# Patient Record
Sex: Female | Born: 1970 | Race: White | Hispanic: No | Marital: Married | Smoking: Never smoker
Health system: Southern US, Community
[De-identification: ages and names within clinical notes are randomized; demographics above are authoritative.]

## PROBLEM LIST (undated history)

## (undated) DIAGNOSIS — J45909 Unspecified asthma, uncomplicated: Secondary | ICD-10-CM

## (undated) DIAGNOSIS — K219 Gastro-esophageal reflux disease without esophagitis: Secondary | ICD-10-CM

## (undated) HISTORY — PX: TUBAL LIGATION: SHX77

## (undated) HISTORY — DX: Unspecified asthma, uncomplicated: J45.909

## (undated) HISTORY — DX: Gastro-esophageal reflux disease without esophagitis: K21.9

---

## 1995-06-13 HISTORY — PX: KNEE SURGERY: SHX244

## 2004-08-04 ENCOUNTER — Ambulatory Visit: Payer: Self-pay | Admitting: Family Medicine

## 2006-10-24 ENCOUNTER — Emergency Department (HOSPITAL_COMMUNITY): Admission: EM | Admit: 2006-10-24 | Discharge: 2006-10-24 | Payer: Self-pay | Admitting: *Deleted

## 2016-03-09 DIAGNOSIS — S39012A Strain of muscle, fascia and tendon of lower back, initial encounter: Secondary | ICD-10-CM | POA: Diagnosis not present

## 2016-03-09 DIAGNOSIS — Z886 Allergy status to analgesic agent status: Secondary | ICD-10-CM | POA: Diagnosis not present

## 2016-03-09 DIAGNOSIS — M545 Low back pain: Secondary | ICD-10-CM | POA: Diagnosis not present

## 2016-07-21 DIAGNOSIS — R7309 Other abnormal glucose: Secondary | ICD-10-CM | POA: Diagnosis not present

## 2016-07-21 DIAGNOSIS — K219 Gastro-esophageal reflux disease without esophagitis: Secondary | ICD-10-CM | POA: Diagnosis not present

## 2016-07-21 DIAGNOSIS — J45909 Unspecified asthma, uncomplicated: Secondary | ICD-10-CM | POA: Diagnosis not present

## 2016-10-03 DIAGNOSIS — L237 Allergic contact dermatitis due to plants, except food: Secondary | ICD-10-CM | POA: Diagnosis not present

## 2016-10-03 DIAGNOSIS — J45909 Unspecified asthma, uncomplicated: Secondary | ICD-10-CM | POA: Diagnosis not present

## 2017-01-23 ENCOUNTER — Encounter: Payer: Self-pay | Admitting: Physician Assistant

## 2017-01-31 ENCOUNTER — Encounter: Payer: Self-pay | Admitting: Physician Assistant

## 2017-01-31 ENCOUNTER — Ambulatory Visit (INDEPENDENT_AMBULATORY_CARE_PROVIDER_SITE_OTHER): Payer: BLUE CROSS/BLUE SHIELD | Admitting: Physician Assistant

## 2017-01-31 VITALS — BP 124/86 | HR 75 | Temp 97.6°F | Ht 63.75 in | Wt 179.6 lb

## 2017-01-31 DIAGNOSIS — Z01419 Encounter for gynecological examination (general) (routine) without abnormal findings: Secondary | ICD-10-CM

## 2017-01-31 DIAGNOSIS — F411 Generalized anxiety disorder: Secondary | ICD-10-CM | POA: Insufficient documentation

## 2017-01-31 DIAGNOSIS — L239 Allergic contact dermatitis, unspecified cause: Secondary | ICD-10-CM

## 2017-01-31 DIAGNOSIS — F321 Major depressive disorder, single episode, moderate: Secondary | ICD-10-CM

## 2017-01-31 DIAGNOSIS — L509 Urticaria, unspecified: Secondary | ICD-10-CM

## 2017-01-31 DIAGNOSIS — K219 Gastro-esophageal reflux disease without esophagitis: Secondary | ICD-10-CM

## 2017-01-31 DIAGNOSIS — R87610 Atypical squamous cells of undetermined significance on cytologic smear of cervix (ASC-US): Secondary | ICD-10-CM | POA: Diagnosis not present

## 2017-01-31 DIAGNOSIS — J4520 Mild intermittent asthma, uncomplicated: Secondary | ICD-10-CM

## 2017-01-31 MED ORDER — OMEPRAZOLE 20 MG PO CPDR: 20.0000 mg | DELAYED_RELEASE_CAPSULE | Freq: Every day | ORAL | 11 refills | Status: DC

## 2017-01-31 MED ORDER — ALPRAZOLAM 0.25 MG PO TABS: 0.2500 mg | ORAL_TABLET | Freq: Two times a day (BID) | ORAL | 0 refills | Status: DC | PRN

## 2017-01-31 MED ORDER — LORATADINE 10 MG PO TABS: 10.0000 mg | ORAL_TABLET | Freq: Every day | ORAL | 11 refills | Status: DC

## 2017-01-31 MED ORDER — CITALOPRAM HYDROBROMIDE 20 MG PO TABS: 10.0000 mg | ORAL_TABLET | Freq: Every day | ORAL | 2 refills | Status: DC

## 2017-01-31 NOTE — Progress Notes (Signed)
BP 124/86   Pulse 75   Temp 97.6 F (36.4 C) (Oral)   Ht 5' 3.75" (1.619 m)   Wt 179 lb 9.6 oz (81.5 kg)   LMP 01/24/2017   BMI 31.07 kg/m    Subjective:    Patient ID: Rebecca Pham, female    DOB: 1970-11-18, 46 y.o.   MRN: [id number]  HPI: Rebecca Pham is a 46 y.o. female presenting on 01/31/2017 for Annual Exam  This patient comes in for annual well physical examination. All medications are reviewed today. There are no reports of any problems with the medications. All of the medical conditions are reviewed and updated.  Lab work is reviewed and will be ordered as medically necessary. There are no new problems reported with today's visit.  Patient reports doing well overall.  Patient does have a history of GERD, mild asthma, depression, generalized anxiety, allergic dermatitis. All of her medications are reviewed and will be updated. We'll plan to recheck her in one month on her depression medication.  Relevant past medical, surgical, family and social history reviewed and updated as indicated. Allergies and medications reviewed and updated.  Past Medical History:  Diagnosis Date  . Asthma   . GERD (gastroesophageal reflux disease)     Past Surgical History:  Procedure Laterality Date  . KNEE SURGERY  1997  . TUBAL LIGATION      Review of Systems  Constitutional: Negative.  Negative for activity change, fatigue and fever.  HENT: Negative.   Eyes: Negative.   Respiratory: Negative.  Negative for cough.   Cardiovascular: Negative.  Negative for chest pain.  Gastrointestinal: Negative.  Negative for abdominal pain.  Endocrine: Negative.   Genitourinary: Negative.  Negative for dysuria.  Musculoskeletal: Negative.   Skin: Negative.   Neurological: Negative.     Allergies as of 01/31/2017      Reactions   Asa [aspirin] Swelling      Medication List       Accurate as of 01/31/17  4:26 PM. Always use your most recent med list.          ALPRAZolam 0.25 MG  tablet Commonly known as:  XANAX Take 1 tablet (0.25 mg total) by mouth 2 (two) times daily as needed for anxiety.   citalopram 20 MG tablet Commonly known as:  CELEXA Take 0.5-1 tablets (10-20 mg total) by mouth daily.   loratadine 10 MG tablet Commonly known as:  CLARITIN Take 1 tablet (10 mg total) by mouth daily.   omeprazole 20 MG capsule Commonly known as:  PRILOSEC Take 1 capsule (20 mg total) by mouth daily.   PROAIR HFA 108 (90 Base) MCG/ACT inhaler Generic drug:  albuterol            Discharge Care Instructions        Start     Ordered   01/31/17 0000  omeprazole (PRILOSEC) 20 MG capsule  Daily    Question:  Supervising Provider  Answer:  Elenora Gamma   01/31/17 1229   01/31/17 0000  citalopram (CELEXA) 20 MG tablet  Daily    Question:  Supervising Provider  Answer:  Elenora Gamma   01/31/17 1229   01/31/17 0000  ALPRAZolam (XANAX) 0.25 MG tablet  2 times daily PRN    Question:  Supervising Provider  Answer:  Elenora Gamma   01/31/17 1234   01/31/17 0000  loratadine (CLARITIN) 10 MG tablet  Daily    Question:  Supervising Provider  Answer:  Elenora Gamma   01/31/17 1234   01/31/17 0000  Pap IG w/ reflex to HPV when ASC-U    Question Answer Comment  Date of LMP? 01/24/2017   Number of Slides/Vials: 1      01/31/17 1245         Objective:    BP 124/86   Pulse 75   Temp 97.6 F (36.4 C) (Oral)   Ht 5' 3.75" (1.619 m)   Wt 179 lb 9.6 oz (81.5 kg)   LMP 01/24/2017   BMI 31.07 kg/m   Allergies  Allergen Reactions  . Asa [Aspirin] Swelling    Physical Exam  Constitutional: She is oriented to person, place, and time. She appears well-developed and well-nourished.  HENT:  Head: Normocephalic and atraumatic.  Eyes: Pupils are equal, round, and reactive to light. Conjunctivae and EOM are normal.  Neck: Normal range of motion. Neck supple.  Cardiovascular: Normal rate, regular rhythm, normal heart sounds and intact distal  pulses.   Pulmonary/Chest: Effort normal and breath sounds normal. Right breast exhibits no mass, no skin change and no tenderness. Left breast exhibits no mass, no skin change and no tenderness. Breasts are symmetrical.  Abdominal: Soft. Bowel sounds are normal.  Genitourinary: Vagina normal and uterus normal. Rectal exam shows no fissure. No breast swelling, tenderness, discharge or bleeding. There is no tenderness or lesion on the right labia. There is no tenderness or lesion on the left labia. Uterus is not deviated, not enlarged and not tender. Cervix exhibits no motion tenderness, no discharge and no friability. Right adnexum displays no mass, no tenderness and no fullness. Left adnexum displays no mass, no tenderness and no fullness. No tenderness or bleeding in the vagina. No vaginal discharge found.  Neurological: She is alert and oriented to person, place, and time. She has normal reflexes.  Skin: Skin is warm and dry. No rash noted.  Psychiatric: She has a normal mood and affect. Her behavior is normal. Judgment and thought content normal.    No results found for this or any previous visit.    Assessment & Plan:   1. Well female exam with routine gynecological exam - Pap IG w/ reflex to HPV when ASC-U Encourage mammogram at her next opportunity at her employer.  2. Gastroesophageal reflux disease without esophagitis - omeprazole (PRILOSEC) 20 MG capsule; Take 1 capsule (20 mg total) by mouth daily.  Dispense: 30 capsule; Refill: 11  3. Mild intermittent asthma without complication - PROAIR HFA 108 (90 Base) MCG/ACT inhaler;   4. Depression, major, single episode, moderate (HCC) - citalopram (CELEXA) 20 MG tablet; Take 0.5-1 tablets (10-20 mg total) by mouth daily.  Dispense: 30 tablet; Refill: 2  5. GAD (generalized anxiety disorder) - ALPRAZolam (XANAX) 0.25 MG tablet; Take 1 tablet (0.25 mg total) by mouth 2 (two) times daily as needed for anxiety.  Dispense: 30 tablet;  Refill: 0  6. Allergic dermatitis - loratadine (CLARITIN) 10 MG tablet; Take 1 tablet (10 mg total) by mouth daily.  Dispense: 30 tablet; Refill: 11  7. Hives    Current Outpatient Prescriptions:  .  ALPRAZolam (XANAX) 0.25 MG tablet, Take 1 tablet (0.25 mg total) by mouth 2 (two) times daily as needed for anxiety., Disp: 30 tablet, Rfl: 0 .  citalopram (CELEXA) 20 MG tablet, Take 0.5-1 tablets (10-20 mg total) by mouth daily., Disp: 30 tablet, Rfl: 2 .  loratadine (CLARITIN) 10 MG tablet, Take 1 tablet (10 mg total) by mouth daily.,  Disp: 30 tablet, Rfl: 11 .  omeprazole (PRILOSEC) 20 MG capsule, Take 1 capsule (20 mg total) by mouth daily., Disp: 30 capsule, Rfl: 11 .  PROAIR HFA 108 (90 Base) MCG/ACT inhaler, , Disp: , Rfl:  Continue all other maintenance medications as listed above.  Follow up plan: Return in about 4 weeks (around 02/28/2017) for recheck.  Educational handout given for survey  Remus Loffler PA-C [facility] [address]  [place], [place] [address] [phone]   01/31/2017, 4:26 PM

## 2017-01-31 NOTE — Patient Instructions (Signed)
In a few days you may receive a survey in the mail or online from [employer] regarding your visit with us today. Please take a moment to fill this out. Your feedback is very important to our whole office. It can help us better understand your needs as well as improve your experience and satisfaction. Thank you for taking your time to complete it. We care about you.  Gaines Cartmell, PA-C  

## 2017-02-07 DIAGNOSIS — E663 Overweight: Secondary | ICD-10-CM | POA: Diagnosis not present

## 2017-02-07 DIAGNOSIS — K219 Gastro-esophageal reflux disease without esophagitis: Secondary | ICD-10-CM | POA: Diagnosis not present

## 2017-02-07 DIAGNOSIS — Z719 Counseling, unspecified: Secondary | ICD-10-CM | POA: Diagnosis not present

## 2017-02-07 DIAGNOSIS — Z008 Encounter for other general examination: Secondary | ICD-10-CM | POA: Diagnosis not present

## 2017-02-07 DIAGNOSIS — J45909 Unspecified asthma, uncomplicated: Secondary | ICD-10-CM | POA: Diagnosis not present

## 2017-02-07 DIAGNOSIS — R7309 Other abnormal glucose: Secondary | ICD-10-CM | POA: Diagnosis not present

## 2017-02-09 ENCOUNTER — Other Ambulatory Visit: Payer: Self-pay

## 2017-02-09 DIAGNOSIS — A630 Anogenital (venereal) warts: Secondary | ICD-10-CM

## 2017-02-09 LAB — PAP IG W/ RFLX HPV ASCU: PAP Smear Comment: 0

## 2017-02-09 LAB — HPV DNA PROBE HIGH RISK, AMPLIFIED: HPV, high-risk: POSITIVE — AB

## 2017-02-09 NOTE — Progress Notes (Signed)
Patient notified of results and she wants to be referred to [place]  Referral placed

## 2017-03-05 ENCOUNTER — Ambulatory Visit (INDEPENDENT_AMBULATORY_CARE_PROVIDER_SITE_OTHER): Payer: BLUE CROSS/BLUE SHIELD | Admitting: Physician Assistant

## 2017-03-05 ENCOUNTER — Encounter (INDEPENDENT_AMBULATORY_CARE_PROVIDER_SITE_OTHER): Payer: Self-pay

## 2017-03-05 ENCOUNTER — Encounter: Payer: Self-pay | Admitting: Physician Assistant

## 2017-03-05 VITALS — BP 132/89 | HR 87 | Temp 98.5°F | Ht 63.75 in | Wt 174.8 lb

## 2017-03-05 DIAGNOSIS — R87610 Atypical squamous cells of undetermined significance on cytologic smear of cervix (ASC-US): Secondary | ICD-10-CM | POA: Diagnosis not present

## 2017-03-05 DIAGNOSIS — F411 Generalized anxiety disorder: Secondary | ICD-10-CM

## 2017-03-05 DIAGNOSIS — R87810 Cervical high risk human papillomavirus (HPV) DNA test positive: Secondary | ICD-10-CM | POA: Diagnosis not present

## 2017-03-05 DIAGNOSIS — F321 Major depressive disorder, single episode, moderate: Secondary | ICD-10-CM | POA: Diagnosis not present

## 2017-03-05 MED ORDER — CITALOPRAM HYDROBROMIDE 20 MG PO TABS: 10.0000 mg | ORAL_TABLET | Freq: Every day | ORAL | 11 refills | Status: DC

## 2017-03-05 NOTE — Progress Notes (Signed)
BP 132/89   Pulse 87   Temp 98.5 F (36.9 C) (Oral)   Ht 5' 3.75" (1.619 m)   Wt 174 lb 12.8 oz (79.3 kg)   BMI 30.24 kg/m    Subjective:    Patient ID: Rebecca Pham, female    DOB: 1970/10/22, 46 y.o.   MRN: [id number]  HPI: Rebecca Pham is a 46 y.o. female presenting on 03/05/2017 for Follow-up (4 week rck ) This patient comes in for recheck on her depression and anxiety. She reports that she is doing much better with the citalopram. She has not had to take any Xanax. She only keeps it for a severe episode. She is having some discord with her current boyfriend. She does not feel in danger for her life. Depression screen [facility] 2/9 03/05/2017 01/31/2017  Decreased Interest 0 0  Down, Depressed, Hopeless 0 0  PHQ - 2 Score 0 0     The patient does have a recent Pap smear that was ASCUS positive with HPV high risk. She has had this before and followed by Dr. Donzetta Matters. We will refer her back to her for further evaluation.  Relevant past medical, surgical, family and social history reviewed and updated as indicated. Allergies and medications reviewed and updated.  Past Medical History:  Diagnosis Date  . Asthma   . GERD (gastroesophageal reflux disease)     Past Surgical History:  Procedure Laterality Date  . KNEE SURGERY  1997  . TUBAL LIGATION      Review of Systems  Constitutional: Negative.  Negative for activity change, fatigue and fever.  HENT: Negative.   Eyes: Negative.   Respiratory: Negative.  Negative for cough.   Cardiovascular: Negative.  Negative for chest pain.  Gastrointestinal: Negative.  Negative for abdominal pain.  Endocrine: Negative.   Genitourinary: Negative.  Negative for dysuria.  Musculoskeletal: Negative.   Skin: Negative.   Neurological: Negative.     Allergies as of 03/05/2017      Reactions   Asa [aspirin] Swelling      Medication List       Accurate as of 03/05/17  1:52 PM. Always use your most recent med list.            ALPRAZolam 0.25 MG tablet Commonly known as:  XANAX Take 1 tablet (0.25 mg total) by mouth 2 (two) times daily as needed for anxiety.   citalopram 20 MG tablet Commonly known as:  CELEXA Take 0.5-1 tablets (10-20 mg total) by mouth daily.   loratadine 10 MG tablet Commonly known as:  CLARITIN Take 1 tablet (10 mg total) by mouth daily.   omeprazole 20 MG capsule Commonly known as:  PRILOSEC Take 1 capsule (20 mg total) by mouth daily.   PROAIR HFA 108 (90 Base) MCG/ACT inhaler Generic drug:  albuterol            Discharge Care Instructions        Start     Ordered   03/05/17 0000  citalopram (CELEXA) 20 MG tablet  Daily    Question:  Supervising Provider  Answer:  Elenora Gamma   03/05/17 1031   03/05/17 0000  Ambulatory referral to Obstetrics / Gynecology    Comments:  Send pap report. Appt next week Wed or Thur   03/05/17 1037         Objective:    BP 132/89   Pulse 87   Temp 98.5 F (36.9 C) (Oral)   Ht 5'  3.75" (1.619 m)   Wt 174 lb 12.8 oz (79.3 kg)   BMI 30.24 kg/m   Allergies  Allergen Reactions  . Asa [Aspirin] Swelling    Physical Exam  Constitutional: She is oriented to person, place, and time. She appears well-developed and well-nourished.  HENT:  Head: Normocephalic and atraumatic.  Eyes: Pupils are equal, round, and reactive to light. Conjunctivae and EOM are normal.  Cardiovascular: Normal rate, regular rhythm, normal heart sounds and intact distal pulses.   Pulmonary/Chest: Effort normal and breath sounds normal.  Abdominal: Soft. Bowel sounds are normal.  Neurological: She is alert and oriented to person, place, and time. She has normal reflexes.  Skin: Skin is warm and dry. No rash noted.  Psychiatric: She has a normal mood and affect. Her behavior is normal. Judgment and thought content normal.  Nursing note and vitals reviewed.   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    PATHOLOGIST PROVIDED ICD10: Comment    Note: Comment    Test Methodology CANCELED    PAP Reflex Comment       Assessment & Plan:   1. Depression, major, single episode, moderate (HCC) - citalopram (CELEXA) 20 MG tablet; Take 0.5-1 tablets (10-20 mg total) by mouth daily.  Dispense: 30 tablet; Refill: 11  2. GAD (generalized anxiety disorder)  3. ASCUS with positive high risk HPV cervical - Ambulatory referral to Obstetrics / Gynecology    Current Outpatient Prescriptions:  .  ALPRAZolam (XANAX) 0.25 MG tablet, Take 1 tablet (0.25 mg total) by mouth 2 (two) times daily as needed for anxiety., Disp: 30 tablet, Rfl: 0 .  citalopram (CELEXA) 20 MG tablet, Take 0.5-1 tablets (10-20 mg total) by mouth daily., Disp: 30 tablet, Rfl: 11 .  loratadine (CLARITIN) 10 MG tablet, Take 1 tablet (10 mg total) by mouth daily., Disp: 30 tablet, Rfl: 11 .  omeprazole (PRILOSEC) 20 MG capsule, Take 1 capsule (20 mg total) by mouth daily., Disp: 30 capsule, Rfl: 11 .  PROAIR HFA 108 (90 Base) MCG/ACT inhaler, , Disp: , Rfl:  Continue all other maintenance medications as listed above.  Follow up plan: Return in about 6 months (around 09/02/2017) for recheck.  Educational handout given for survey  Remus Loffler PA-C [facility] [address]  [place], [place] [address] [phone]   03/05/2017, 1:52 PM

## 2017-03-05 NOTE — Patient Instructions (Signed)
In a few days you may receive a survey in the mail or online from [employer] regarding your visit with us today. Please take a moment to fill this out. Your feedback is very important to our whole office. It can help us better understand your needs as well as improve your experience and satisfaction. Thank you for taking your time to complete it. We care about you.  Caine Barfield, PA-C  

## 2017-04-09 DIAGNOSIS — R0683 Snoring: Secondary | ICD-10-CM | POA: Diagnosis not present

## 2017-04-09 DIAGNOSIS — E663 Overweight: Secondary | ICD-10-CM | POA: Diagnosis not present

## 2017-04-21 DIAGNOSIS — J45901 Unspecified asthma with (acute) exacerbation: Secondary | ICD-10-CM | POA: Diagnosis not present

## 2017-04-21 DIAGNOSIS — R05 Cough: Secondary | ICD-10-CM | POA: Diagnosis not present

## 2017-04-21 DIAGNOSIS — J069 Acute upper respiratory infection, unspecified: Secondary | ICD-10-CM | POA: Diagnosis not present

## 2017-04-26 DIAGNOSIS — R87810 Cervical high risk human papillomavirus (HPV) DNA test positive: Secondary | ICD-10-CM | POA: Diagnosis not present

## 2017-04-26 DIAGNOSIS — R87610 Atypical squamous cells of undetermined significance on cytologic smear of cervix (ASC-US): Secondary | ICD-10-CM | POA: Diagnosis not present

## 2017-04-26 DIAGNOSIS — N871 Moderate cervical dysplasia: Secondary | ICD-10-CM | POA: Diagnosis not present

## 2017-05-24 DIAGNOSIS — D069 Carcinoma in situ of cervix, unspecified: Secondary | ICD-10-CM | POA: Diagnosis not present

## 2017-05-24 DIAGNOSIS — Z6828 Body mass index (BMI) 28.0-28.9, adult: Secondary | ICD-10-CM | POA: Diagnosis not present

## 2017-06-29 ENCOUNTER — Ambulatory Visit (INDEPENDENT_AMBULATORY_CARE_PROVIDER_SITE_OTHER): Payer: BLUE CROSS/BLUE SHIELD | Admitting: Family Medicine

## 2017-06-29 ENCOUNTER — Encounter: Payer: Self-pay | Admitting: Family Medicine

## 2017-06-29 VITALS — BP 129/84 | HR 78 | Temp 98.4°F | Ht 63.75 in | Wt 182.0 lb

## 2017-06-29 DIAGNOSIS — J4521 Mild intermittent asthma with (acute) exacerbation: Secondary | ICD-10-CM

## 2017-06-29 MED ORDER — PREDNISONE 20 MG PO TABS: ORAL_TABLET | ORAL | 0 refills | Status: DC

## 2017-06-29 NOTE — Progress Notes (Signed)
BP 129/84   Pulse 78   Temp 98.4 F (36.9 C) (Oral)   Ht 5' 3.75" (1.619 m)   Wt 182 lb (82.6 kg)   SpO2 99%   BMI 31.49 kg/m    Subjective:    Patient ID: Rebecca Pham, female    DOB: 01/04/1971, 47 y.o.   MRN: [id number]  HPI: Rebecca Pham is a 47 y.o. female presenting on 06/29/2017 for Cough (tried Robitussin DM but it caused an allergic reaction with her Celexa); Asthma; and Left ear pain   Cough  Associated symptoms include ear pain (left ear pain), postnasal drip, rhinorrhea and shortness of breath. Pertinent negatives include no chest pain, chills, fever, sore throat or wheezing. Her past medical history is significant for asthma.  Asthma  She complains of cough (dry, constant cough) and shortness of breath. There is no wheezing. Associated symptoms include ear pain (left ear pain), postnasal drip and rhinorrhea. Pertinent negatives include no chest pain, fever or sore throat. Her past medical history is significant for asthma.   Patient presents to clinic with dry cough x 1 week that is worsening her asthma symptoms. She was treated in the ED right before Christmas for breathing difficulties and a viral infection which was treated with prednisone. She describes her cough as a dry, constant cough with no relief. She is also complaining of a left earache, congestion, and trouble breathing. She tried Robitussin DM a week ago, but had an allergic reaction that caused swelling of the lips. She has also been taking Tylenol XR to relieve her symptoms. She admits to using her Albuterol inhaler multiple times per day to relieve her shortness of breath. Patient denies fever, chills, wheezing, hemoptyosis, chest pain, and sore throat.   Relevant past medical, surgical, family and social history reviewed and updated as indicated. Interim medical history since our last visit reviewed. Allergies and medications reviewed and updated.  Review of Systems  Constitutional: Negative for chills,  fatigue and fever.  HENT: Positive for congestion, ear pain (left ear pain), postnasal drip and rhinorrhea. Negative for ear discharge, sinus pressure, sinus pain and sore throat.   Eyes: Negative for discharge and itching.  Respiratory: Positive for cough (dry, constant cough) and shortness of breath. Negative for wheezing.   Cardiovascular: Negative for chest pain.  Gastrointestinal: Negative for diarrhea and nausea.    Per HPI unless specifically indicated above   Allergies as of 06/29/2017      Reactions   Asa [aspirin] Swelling      Medication List        Accurate as of 06/29/17  1:53 PM. Always use your most recent med list.          ALPRAZolam 0.25 MG tablet Commonly known as:  XANAX Take 1 tablet (0.25 mg total) by mouth 2 (two) times daily as needed for anxiety.   citalopram 20 MG tablet Commonly known as:  CELEXA Take 0.5-1 tablets (10-20 mg total) by mouth daily.   loratadine 10 MG tablet Commonly known as:  CLARITIN Take 1 tablet (10 mg total) by mouth daily.   omeprazole 20 MG capsule Commonly known as:  PRILOSEC Take 1 capsule (20 mg total) by mouth daily.   predniSONE 20 MG tablet Commonly known as:  DELTASONE Take 3 tabs daily for 1 week, then 2 tabs daily for week 2, then 1 tab daily for week 3.   PROAIR HFA 108 (90 Base) MCG/ACT inhaler Generic drug:  albuterol  Objective:    BP 129/84   Pulse 78   Temp 98.4 F (36.9 C) (Oral)   Ht 5' 3.75" (1.619 m)   Wt 182 lb (82.6 kg)   SpO2 99%   BMI 31.49 kg/m   Wt Readings from Last 3 Encounters:  06/29/17 182 lb (82.6 kg)  03/05/17 174 lb 12.8 oz (79.3 kg)  01/31/17 179 lb 9.6 oz (81.5 kg)    Physical Exam  Constitutional: She is oriented to person, place, and time. She appears well-developed and well-nourished. No distress.  HENT:  Right Ear: External ear normal.  Left Ear: External ear normal.  Nose: Nose normal.  Mouth/Throat: Posterior oropharyngeal erythema (mild) present.  No oropharyngeal exudate.  Eyes: Conjunctivae are normal. Right eye exhibits no discharge. Left eye exhibits no discharge.  Cardiovascular: Normal rate and regular rhythm. Exam reveals gallop. Exam reveals no friction rub.  No murmur heard. Pulmonary/Chest: Effort normal and breath sounds normal. No accessory muscle usage. No respiratory distress. She has no wheezes. She has no rhonchi. She has no rales.  Lymphadenopathy:    She has no cervical adenopathy.  Neurological: She is alert and oriented to person, place, and time.  Psychiatric: She has a normal mood and affect. Her behavior is normal.      Assessment & Plan:   Problem List Items Addressed This Visit    None    Visit Diagnoses    Mild intermittent asthma with exacerbation    -  Primary   Gave refill and prednisone and recommended continue albuterol likely viral.   Relevant Medications   predniSONE (DELTASONE) 20 MG tablet     Patient presents to clinic with cough x1 week with difficulties breathing. Patient notes that the cough has caused her asthma to worsen which has required her to use her albuterol inhaler multiple times a day. Physical exam was insignificant except for mild erythema in the throat. Patient was started on prednisone 20mg  tablets x 3 weeks. She was given a sample ICS (BREO) to see if it helps with her breathing difficulties and was instructed to continue her albuterol inhaler as needed. She will follow-up with Rebecca Pham in about two weeks prior to her surgery to ensure her lung function is normal.  Follow up plan: Return if symptoms worsen or fail to improve.  Counseling provided for all of the vaccine components No orders of the defined types were placed in this encounter.  Patient seen and examined with Caprice Renshaw PA student.  Agree with assessment and plan above. Arville Care, MD [facility] 07/03/2017, 10:09 PM

## 2017-07-04 ENCOUNTER — Ambulatory Visit: Payer: BLUE CROSS/BLUE SHIELD | Admitting: Physician Assistant

## 2017-07-11 ENCOUNTER — Ambulatory Visit: Payer: BLUE CROSS/BLUE SHIELD | Admitting: Physician Assistant

## 2017-07-17 ENCOUNTER — Other Ambulatory Visit: Payer: Self-pay | Admitting: Family Medicine

## 2017-07-23 DIAGNOSIS — K219 Gastro-esophageal reflux disease without esophagitis: Secondary | ICD-10-CM | POA: Diagnosis not present

## 2017-07-23 DIAGNOSIS — Z886 Allergy status to analgesic agent status: Secondary | ICD-10-CM | POA: Diagnosis not present

## 2017-07-23 DIAGNOSIS — F419 Anxiety disorder, unspecified: Secondary | ICD-10-CM | POA: Diagnosis not present

## 2017-07-23 DIAGNOSIS — Z9851 Tubal ligation status: Secondary | ICD-10-CM | POA: Diagnosis not present

## 2017-07-23 DIAGNOSIS — J45909 Unspecified asthma, uncomplicated: Secondary | ICD-10-CM | POA: Diagnosis not present

## 2017-07-23 DIAGNOSIS — Z79899 Other long term (current) drug therapy: Secondary | ICD-10-CM | POA: Diagnosis not present

## 2017-07-23 DIAGNOSIS — N840 Polyp of corpus uteri: Secondary | ICD-10-CM | POA: Diagnosis not present

## 2017-07-23 DIAGNOSIS — N926 Irregular menstruation, unspecified: Secondary | ICD-10-CM | POA: Diagnosis not present

## 2017-07-23 DIAGNOSIS — D62 Acute posthemorrhagic anemia: Secondary | ICD-10-CM | POA: Diagnosis not present

## 2017-07-23 DIAGNOSIS — D251 Intramural leiomyoma of uterus: Secondary | ICD-10-CM | POA: Diagnosis not present

## 2017-07-23 DIAGNOSIS — F329 Major depressive disorder, single episode, unspecified: Secondary | ICD-10-CM | POA: Diagnosis not present

## 2017-07-23 DIAGNOSIS — R220 Localized swelling, mass and lump, head: Secondary | ICD-10-CM | POA: Diagnosis not present

## 2017-07-23 DIAGNOSIS — N871 Moderate cervical dysplasia: Secondary | ICD-10-CM | POA: Diagnosis not present

## 2017-07-24 ENCOUNTER — Encounter: Payer: Self-pay | Admitting: Family Medicine

## 2017-07-24 DIAGNOSIS — Z886 Allergy status to analgesic agent status: Secondary | ICD-10-CM | POA: Diagnosis not present

## 2017-07-24 DIAGNOSIS — N871 Moderate cervical dysplasia: Secondary | ICD-10-CM | POA: Diagnosis not present

## 2017-07-24 DIAGNOSIS — J45909 Unspecified asthma, uncomplicated: Secondary | ICD-10-CM | POA: Diagnosis not present

## 2017-07-24 DIAGNOSIS — D251 Intramural leiomyoma of uterus: Secondary | ICD-10-CM | POA: Diagnosis not present

## 2017-07-24 DIAGNOSIS — Z79899 Other long term (current) drug therapy: Secondary | ICD-10-CM | POA: Diagnosis not present

## 2017-07-24 DIAGNOSIS — K219 Gastro-esophageal reflux disease without esophagitis: Secondary | ICD-10-CM | POA: Diagnosis not present

## 2017-07-24 DIAGNOSIS — N840 Polyp of corpus uteri: Secondary | ICD-10-CM | POA: Diagnosis not present

## 2017-07-24 DIAGNOSIS — R220 Localized swelling, mass and lump, head: Secondary | ICD-10-CM | POA: Diagnosis not present

## 2017-07-24 DIAGNOSIS — D62 Acute posthemorrhagic anemia: Secondary | ICD-10-CM | POA: Diagnosis not present

## 2017-07-24 DIAGNOSIS — F329 Major depressive disorder, single episode, unspecified: Secondary | ICD-10-CM | POA: Diagnosis not present

## 2017-07-24 DIAGNOSIS — Z9851 Tubal ligation status: Secondary | ICD-10-CM | POA: Diagnosis not present

## 2017-07-24 DIAGNOSIS — N879 Dysplasia of cervix uteri, unspecified: Secondary | ICD-10-CM | POA: Diagnosis not present

## 2017-07-24 DIAGNOSIS — N926 Irregular menstruation, unspecified: Secondary | ICD-10-CM | POA: Diagnosis not present

## 2017-07-24 DIAGNOSIS — F419 Anxiety disorder, unspecified: Secondary | ICD-10-CM | POA: Diagnosis not present

## 2017-07-25 DIAGNOSIS — D251 Intramural leiomyoma of uterus: Secondary | ICD-10-CM | POA: Diagnosis not present

## 2017-07-25 DIAGNOSIS — J45909 Unspecified asthma, uncomplicated: Secondary | ICD-10-CM | POA: Diagnosis not present

## 2017-07-25 DIAGNOSIS — N871 Moderate cervical dysplasia: Secondary | ICD-10-CM | POA: Diagnosis not present

## 2017-07-25 DIAGNOSIS — R220 Localized swelling, mass and lump, head: Secondary | ICD-10-CM | POA: Diagnosis not present

## 2017-07-25 DIAGNOSIS — N840 Polyp of corpus uteri: Secondary | ICD-10-CM | POA: Diagnosis not present

## 2017-07-25 DIAGNOSIS — D62 Acute posthemorrhagic anemia: Secondary | ICD-10-CM | POA: Diagnosis not present

## 2017-07-25 DIAGNOSIS — F329 Major depressive disorder, single episode, unspecified: Secondary | ICD-10-CM | POA: Diagnosis not present

## 2017-07-25 DIAGNOSIS — Z79899 Other long term (current) drug therapy: Secondary | ICD-10-CM | POA: Diagnosis not present

## 2017-07-25 DIAGNOSIS — F419 Anxiety disorder, unspecified: Secondary | ICD-10-CM | POA: Diagnosis not present

## 2017-07-25 DIAGNOSIS — N926 Irregular menstruation, unspecified: Secondary | ICD-10-CM | POA: Diagnosis not present

## 2017-07-25 DIAGNOSIS — K219 Gastro-esophageal reflux disease without esophagitis: Secondary | ICD-10-CM | POA: Diagnosis not present

## 2017-07-25 DIAGNOSIS — Z886 Allergy status to analgesic agent status: Secondary | ICD-10-CM | POA: Diagnosis not present

## 2017-07-25 DIAGNOSIS — Z9851 Tubal ligation status: Secondary | ICD-10-CM | POA: Diagnosis not present

## 2017-07-31 ENCOUNTER — Ambulatory Visit: Payer: BLUE CROSS/BLUE SHIELD | Admitting: Family Medicine

## 2017-07-31 VITALS — BP 115/81 | HR 92 | Temp 97.9°F | Ht 64.0 in | Wt 188.0 lb

## 2017-07-31 DIAGNOSIS — S46001A Unspecified injury of muscle(s) and tendon(s) of the rotator cuff of right shoulder, initial encounter: Secondary | ICD-10-CM

## 2017-07-31 NOTE — Progress Notes (Signed)
Subjective: CC: arm injury PCP: Rebecca LofflerJones, [name], PA-C XBJ:[id number]:Rebecca Pham is a 47 y.o. female presenting to clinic today for:  1. Arm injury Patient reports falling on an outstretched arm about 10 days ago.  She notes she fell over a plantar onto her stomach.  She reports immediate pain in the right shoulder radiating down to the right tricep.  She notes that she now cannot raise her right arm without pain.  She denies any upper extremity weakness, numbness, tingling, joint swelling, ecchymosis.  She has been using oxycodone that she has been prescribed for her hysterectomy with some improvement in pain.   ROS: Per HPI  Allergies  Allergen Reactions  . Asa [Aspirin] Swelling   Past Medical History:  Diagnosis Date  . Asthma   . GERD (gastroesophageal reflux disease)     Current Outpatient Medications:  .  ALPRAZolam (XANAX) 0.25 MG tablet, Take 1 tablet (0.25 mg total) by mouth 2 (two) times daily as needed for anxiety., Disp: 30 tablet, Rfl: 0 .  citalopram (CELEXA) 20 MG tablet, Take 0.5-1 tablets (10-20 mg total) by mouth daily., Disp: 30 tablet, Rfl: 11 .  loratadine (CLARITIN) 10 MG tablet, Take 1 tablet (10 mg total) by mouth daily., Disp: 30 tablet, Rfl: 11 .  omeprazole (PRILOSEC) 20 MG capsule, Take 1 capsule (20 mg total) by mouth daily., Disp: 30 capsule, Rfl: 11 .  predniSONE (DELTASONE) 20 MG tablet, TAKE 3 TABS DAILY FOR 1 WEEK, THEN 2 TABS DAILY FOR WEEK 2, THEN 1 TAB DAILY FOR WEEK 3., Disp: 42 tablet, Rfl: 0 .  PROAIR HFA 108 (90 Base) MCG/ACT inhaler, , Disp: , Rfl:  Social History   Socioeconomic History  . Marital status: Single    Spouse name: Not on file  . Number of children: Not on file  . Years of education: Not on file  . Highest education level: Not on file  Social Needs  . Financial resource strain: Not on file  . Food insecurity - worry: Not on file  . Food insecurity - inability: Not on file  . Transportation needs - medical: Not on file  .  Transportation needs - non-medical: Not on file  Occupational History  . Not on file  Tobacco Use  . Smoking status: Never Smoker  . Smokeless tobacco: Never Used  Substance and Sexual Activity  . Alcohol use: Yes    Comment: occ  . Drug use: No  . Sexual activity: Yes    Birth control/protection: Surgical  Other Topics Concern  . Not on file  Social History Narrative  . Not on file   Family History  Problem Relation Age of Onset  . COPD Father   . Arthritis Father   . Cancer Maternal Grandmother        lung  . Vision loss Paternal Grandfather   . Diabetes Paternal Grandfather   . Heart disease Paternal Grandfather     Objective: Office vital signs reviewed. BP 115/81   Pulse 92   Temp 97.9 F (36.6 C) (Oral)   Ht 5\' 4"  (1.626 m)   Wt 188 lb (85.3 kg)   BMI 32.27 kg/m   Physical Examination:  General: Awake, alert, well nourished, No acute distress Extremities: warm, well perfused, No edema, cyanosis or clubbing; +2 pulses bilaterally MSK: normal gait and normal station  RUE: Has about a 15 degree loss in abduction of the right shoulder.  She is able to raise her right upper extremity but  has significant pain when doing so.  No palpable abnormalities.  No point tenderness to palpation of the right shoulder or clavicle.  No gross deformities noted.  No erythema, ecchymosis.  No swelling.  She does have pain to deep palpation to the tricep.  Positive empty can test on the right. Skin: dry; intact; no rashes or lesions Neuro: 5/5 UE Strength and light touch sensation grossly intact  Assessment/ Plan: 47 y.o. female   1. Injury of right rotator cuff, initial encounter Given her physical exam, I am worried about possible tear of the supraspinatus on the right.  Given the acute nature, I have referred her urgently to orthopedics for further evaluation.  Could consider ultrasound to further evaluate the rotator cuff.  She has oxycodone at home.  I did recommend that she  consider taking an anti-inflammatory like ibuprofen or Aleve.  I recommended icing.  Avoid pain exacerbating activities. - Ambulatory referral to Orthopedic Surgery   Orders Placed This Encounter  Procedures  . Ambulatory referral to Orthopedic Surgery    Referral Priority:   Urgent    Referral Type:   Surgical    Referral Reason:   Specialty Services Required    Requested Specialty:   Orthopedic Surgery    Number of Visits Requested:   1   Cranford Blessinger Hulen Skains, DO [facility] [phone]

## 2017-07-31 NOTE — Patient Instructions (Signed)
I have a high suspicion that you injured your rotator cuff.  There may be a tear.  I have referred you to the orthopedics for further evaluation.  You may continue your medications as prescribed for pain.  If you develop more pronounced weakness, numbness, tingling, joint swelling, please seek immediate medical attention.   Rotator Cuff Injury Rotator cuff injury is any type of injury to the set of muscles and tendons that make up the stabilizing unit of your shoulder. This unit holds the ball of your upper arm bone (humerus) in the socket of your shoulder blade (scapula). What are the causes? Injuries to your rotator cuff most commonly come from sports or activities that cause your arm to be moved repeatedly over your head. Examples of this include throwing, weight lifting, swimming, or racquet sports. Long lasting (chronic) irritation of your rotator cuff can cause soreness and swelling (inflammation), bursitis, and eventual damage to your tendons, such as a tear (rupture). What are the signs or symptoms? Acute rotator cuff tear:  Sudden tearing sensation followed by severe pain shooting from your upper shoulder down your arm toward your elbow.  Decreased range of motion of your shoulder because of pain and muscle spasm.  Severe pain.  Inability to raise your arm out to the side because of pain and loss of muscle power (large tears).  Chronic rotator cuff tear:  Pain that usually is worse at night and may interfere with sleep.  Gradual weakness and decreased shoulder motion as the pain worsens.  Decreased range of motion.  Rotator cuff tendinitis:  Deep ache in your shoulder and the outside upper arm over your shoulder.  Pain that comes on gradually and becomes worse when lifting your arm to the side or turning it inward.  How is this diagnosed? Rotator cuff injury is diagnosed through a medical history, physical exam, and imaging exam. The medical history helps determine the type  of rotator cuff injury. Your health care provider will look at your injured shoulder, feel the injured area, and ask you to move your shoulder in different positions. X-ray exams typically are done to rule out other causes of shoulder pain, such as fractures. MRI is the exam of choice for the most severe shoulder injuries because the images show muscles and tendons. How is this treated? Chronic tear:  Medicine for pain, such as acetaminophen or ibuprofen.  Physical therapy and range-of-motion exercises may be helpful in maintaining shoulder function and strength.  Steroid injections into your shoulder joint.  Surgical repair of the rotator cuff if the injury does not heal with noninvasive treatment.  Acute tear:  Anti-inflammatory medicines such as ibuprofen and naproxen to help reduce pain and swelling.  A sling to help support your arm and rest your rotator cuff muscles. Long-term use of a sling is not advised. It may cause significant stiffening of the shoulder joint.  Surgery may be considered within a few weeks, especially in younger, active people, to return the shoulder to full function.  Indications for surgical treatment include the following: ? Age younger than 60 years. ? Rotator cuff tears that are complete. ? Physical therapy, rest, and anti-inflammatory medicines have been used for 6-8 weeks, with no improvement. ? Employment or sporting activity that requires constant shoulder use.  Tendinitis:  Anti-inflammatory medicines such as ibuprofen and naproxen to help reduce pain and swelling.  A sling to help support your arm and rest your rotator cuff muscles. Long-term use of a sling is not  advised. It may cause significant stiffening of the shoulder joint.  Severe tendinitis may require: ? Steroid injections into your shoulder joint. ? Physical therapy. ? Surgery.  Follow these instructions at home:  Apply ice to your injury: ? Put ice in a plastic bag. ? Place a  towel between your skin and the bag. ? Leave the ice on for 20 minutes, 2-3 times a day.  If you have a shoulder immobilizer (sling and straps), wear it until told otherwise by your health care provider.  You may want to sleep on several pillows or in a recliner at night to lessen swelling and pain.  Only take over-the-counter or prescription medicines for pain, discomfort, or fever as directed by your health care provider.  Do simple hand squeezing exercises with a soft rubber ball to decrease hand swelling. Contact a health care provider if:  Your shoulder pain increases, or new pain or numbness develops in your arm, hand, or fingers.  Your hand or fingers are colder than your other hand. Get help right away if:  Your arm, hand, or fingers are numb or tingling.  Your arm, hand, or fingers are increasingly swollen and painful, or they turn white or blue. This information is not intended to replace advice given to you by your health care provider. Make sure you discuss any questions you have with your health care provider. Document Released: 05/26/2000 Document Revised: 11/04/2015 Document Reviewed: 01/08/2013 Elsevier Interactive Patient Education  2018 [employer]

## 2017-08-09 ENCOUNTER — Ambulatory Visit (INDEPENDENT_AMBULATORY_CARE_PROVIDER_SITE_OTHER): Payer: Self-pay

## 2017-08-09 ENCOUNTER — Ambulatory Visit (INDEPENDENT_AMBULATORY_CARE_PROVIDER_SITE_OTHER): Payer: BLUE CROSS/BLUE SHIELD | Admitting: Orthopaedic Surgery

## 2017-08-09 ENCOUNTER — Encounter (INDEPENDENT_AMBULATORY_CARE_PROVIDER_SITE_OTHER): Payer: Self-pay | Admitting: Orthopaedic Surgery

## 2017-08-09 VITALS — BP 117/83 | HR 97 | Ht 64.0 in | Wt 188.0 lb

## 2017-08-09 DIAGNOSIS — M25511 Pain in right shoulder: Secondary | ICD-10-CM

## 2017-08-09 NOTE — Progress Notes (Signed)
Office Visit Note-orthopedic consultation   Patient: Rebecca Pham           Date of Birth: 1971-05-28           MRN: [id number] Visit Date: 08/09/2017              Requested by: Raliegh Ip, DO [id number] [place], [place] [address] PCP: Remus Loffler, PA-C   Assessment & Plan: Visit Diagnoses:  1. Acute pain of right shoulder     Plan: Has a strain of her shoulder.  Rotator cuff testing is intact.  I will check her in 3 weeks if she is having persistent problems will consider diagnostic imaging.  Labral tests are negative and biceps triceps are intact. Thank you for  the opportunity see her in consultation Follow-Up Instructions: No Follow-up on file.   Orders:  Orders Placed This Encounter  Procedures  . XR Shoulder Right   No orders of the defined types were placed in this encounter.     Procedures: No procedures performed   Clinical Data: No additional findings.   Subjective: Chief Complaint  Patient presents with  . Right Shoulder - Pain    HPI 47 year old female coming in a restaurant tripped against a flowerpot fell forward with outstretched hands.  She injured her right shoulder she states she could not get up for a short period of time and had persistent pain in her right shoulder that radiates down to the deltoid insertion since that time.  No history of past shoulder injury.  She had some abrasion over the right side of her lower abdomen and that has resolved.  Date of fall she thinks was on 07/21/2017 she had some oxycodone from previous hysterectomy she is in the postop time period from this and is out of work until 09/06/2017 from her GYN surgery.  She denies any neck pain no numbness or tingling in her fingers she has pain with activities of daily living washing her hair getting dressed putting her clothes on.  She has pain with motion of her arm and is been persistent for 2 weeks.  Review of Systems 14 point review of systems reviewed and is  noncontributory as it pertains HPI.  Recent hysterectomy surgery history of GI reflux she cannot take anti-inflammatories.   Objective: Vital Signs: BP 117/83   Pulse 97   Ht 5\' 4"  (1.626 m)   Wt 188 lb (85.3 kg)   BMI 32.27 kg/m   Physical Exam  Constitutional: She is oriented to person, place, and time. She appears well-developed.  HENT:  Head: Normocephalic.  Right Ear: External ear normal.  Left Ear: External ear normal.  Eyes: Pupils are equal, round, and reactive to light.  Neck: No tracheal deviation present. No thyromegaly present.  Cardiovascular: Normal rate.  Pulmonary/Chest: Effort normal.  Abdominal: Soft.  Neurological: She is alert and oriented to person, place, and time.  Skin: Skin is warm and dry.  Psychiatric: She has a normal mood and affect. Her behavior is normal.    Ortho Exam pain with outstretched reaching overhead reaching and is limited to 90 degrees.  Supraspinatus isolated testing is stable negative anterior subluxation with external rotation.  She has some tenderness over the long head biceps.  With range of motion pain radiates to the deltoid insertion site.  No supraspinatus weakness on testing.  No supra spinatus atrophy.  Internal/external rotation is strong.  No ecchymosis pectoralis is normal.  The elbow has full  range of motion station her hand is normal she has good cervical range of motion.  Normal heel toe gait. Specialty Comments:  No specialty comments available.  Imaging: Xr Shoulder Right  Result Date: 08/09/2017 X-rays right shoulder obtained and reviewed.  This shows normal AC joint.  Glenohumeral joint is normal negative for acute fracture after her fall. Impression: Right shoulder x-rays negative for acute injury.    PMFS History: Patient Active Problem List   Diagnosis Date Noted  . ASCUS with positive high risk HPV cervical 03/05/2017  . Gastroesophageal reflux disease without esophagitis 01/31/2017  . Mild intermittent  asthma without complication 01/31/2017  . Depression, major, single episode, moderate (HCC) 01/31/2017  . GAD (generalized anxiety disorder) 01/31/2017  . Allergic dermatitis 01/31/2017  . Hives 01/31/2017   Past Medical History:  Diagnosis Date  . Asthma   . GERD (gastroesophageal reflux disease)     Family History  Problem Relation Age of Onset  . COPD Father   . Arthritis Father   . Cancer Maternal Grandmother        lung  . Vision loss Paternal Grandfather   . Diabetes Paternal Grandfather   . Heart disease Paternal Grandfather     Past Surgical History:  Procedure Laterality Date  . KNEE SURGERY  1997  . TUBAL LIGATION     Social History   Occupational History  . Not on file  Tobacco Use  . Smoking status: Never Smoker  . Smokeless tobacco: Never Used  Substance and Sexual Activity  . Alcohol use: Yes    Comment: occ  . Drug use: No  . Sexual activity: Yes    Birth control/protection: Surgical

## 2017-10-17 DIAGNOSIS — Z008 Encounter for other general examination: Secondary | ICD-10-CM | POA: Diagnosis not present

## 2017-10-17 DIAGNOSIS — Z6831 Body mass index (BMI) 31.0-31.9, adult: Secondary | ICD-10-CM | POA: Diagnosis not present

## 2017-10-17 DIAGNOSIS — Z719 Counseling, unspecified: Secondary | ICD-10-CM | POA: Diagnosis not present

## 2017-10-17 DIAGNOSIS — Z1331 Encounter for screening for depression: Secondary | ICD-10-CM | POA: Diagnosis not present

## 2017-10-17 DIAGNOSIS — R7309 Other abnormal glucose: Secondary | ICD-10-CM | POA: Diagnosis not present

## 2017-11-20 DIAGNOSIS — Z1231 Encounter for screening mammogram for malignant neoplasm of breast: Secondary | ICD-10-CM | POA: Diagnosis not present

## 2017-12-26 DIAGNOSIS — Z719 Counseling, unspecified: Secondary | ICD-10-CM | POA: Diagnosis not present

## 2017-12-26 DIAGNOSIS — Z008 Encounter for other general examination: Secondary | ICD-10-CM | POA: Diagnosis not present

## 2017-12-26 DIAGNOSIS — K219 Gastro-esophageal reflux disease without esophagitis: Secondary | ICD-10-CM | POA: Diagnosis not present

## 2017-12-26 DIAGNOSIS — J45909 Unspecified asthma, uncomplicated: Secondary | ICD-10-CM | POA: Diagnosis not present

## 2018-01-01 ENCOUNTER — Encounter: Payer: Self-pay | Admitting: Physician Assistant

## 2018-01-01 ENCOUNTER — Ambulatory Visit: Payer: BLUE CROSS/BLUE SHIELD | Admitting: Physician Assistant

## 2018-01-01 VITALS — BP 113/75 | HR 82 | Temp 97.6°F | Ht 64.0 in | Wt 194.0 lb

## 2018-01-01 DIAGNOSIS — R5383 Other fatigue: Secondary | ICD-10-CM | POA: Diagnosis not present

## 2018-01-01 DIAGNOSIS — R609 Edema, unspecified: Secondary | ICD-10-CM

## 2018-01-01 DIAGNOSIS — N393 Stress incontinence (female) (male): Secondary | ICD-10-CM | POA: Diagnosis not present

## 2018-01-01 DIAGNOSIS — E039 Hypothyroidism, unspecified: Secondary | ICD-10-CM

## 2018-01-01 DIAGNOSIS — F411 Generalized anxiety disorder: Secondary | ICD-10-CM

## 2018-01-01 DIAGNOSIS — R635 Abnormal weight gain: Secondary | ICD-10-CM | POA: Diagnosis not present

## 2018-01-01 DIAGNOSIS — Z0000 Encounter for general adult medical examination without abnormal findings: Secondary | ICD-10-CM | POA: Diagnosis not present

## 2018-01-01 DIAGNOSIS — R928 Other abnormal and inconclusive findings on diagnostic imaging of breast: Secondary | ICD-10-CM | POA: Insufficient documentation

## 2018-01-01 DIAGNOSIS — F321 Major depressive disorder, single episode, moderate: Secondary | ICD-10-CM

## 2018-01-01 DIAGNOSIS — R0681 Apnea, not elsewhere classified: Secondary | ICD-10-CM

## 2018-01-01 DIAGNOSIS — R300 Dysuria: Secondary | ICD-10-CM

## 2018-01-01 DIAGNOSIS — R400 Somnolence: Secondary | ICD-10-CM

## 2018-01-01 LAB — MICROSCOPIC EXAMINATION
Epithelial Cells (non renal): >10 /hpf — AB (ref 0–10)
Renal Epithel, UA: NONE SEEN /hpf

## 2018-01-01 LAB — URINALYSIS, COMPLETE
Bilirubin, UA: NEGATIVE
Glucose, UA: NEGATIVE
Ketones, UA: NEGATIVE
Nitrite, UA: NEGATIVE
Protein, UA: NEGATIVE
Specific Gravity, UA: 1.015 (ref 1.005–1.030)
Urobilinogen, Ur: 0.2 mg/dL (ref 0.2–1.0)
pH, UA: 7 (ref 5.0–7.5)

## 2018-01-01 MED ORDER — ALPRAZOLAM 0.25 MG PO TABS: 0.2500 mg | ORAL_TABLET | Freq: Two times a day (BID) | ORAL | 2 refills | Status: DC | PRN

## 2018-01-01 NOTE — Patient Instructions (Signed)
Edema Edema is when you have too much fluid in your body or under your skin. Edema may make your legs, feet, and ankles swell up. Swelling is also common in looser tissues, like around your eyes. This is a common condition. It gets more common as you get older. There are many possible causes of edema. Eating too much salt (sodium) and being on your feet or sitting for a long time can cause edema in your legs, feet, and ankles. Hot weather may make edema worse. Edema is usually painless. Your skin may look swollen or shiny. Follow these instructions at home:  Keep the swollen body part raised (elevated) above the level of your heart when you are sitting or lying down.  Do not sit still or stand for a long time.  Do not wear tight clothes. Do not wear garters on your upper legs.  Exercise your legs. This can help the swelling go down.  Wear elastic bandages or support stockings as told by your doctor.  Eat a low-salt (low-sodium) diet to reduce fluid as told by your doctor.  Depending on the cause of your swelling, you may need to limit how much fluid you drink (fluid restriction).  Take over-the-counter and prescription medicines only as told by your doctor. Contact a doctor if:  Treatment is not working.  You have heart, liver, or kidney disease and have symptoms of edema.  You have sudden and unexplained weight gain. Get help right away if:  You have shortness of breath or chest pain.  You cannot breathe when you lie down.  You have pain, redness, or warmth in the swollen areas.  You have heart, liver, or kidney disease and get edema all of a sudden.  You have a fever and your symptoms get worse all of a sudden. Summary  Edema is when you have too much fluid in your body or under your skin.  Edema may make your legs, feet, and ankles swell up. Swelling is also common in looser tissues, like around your eyes.  Raise (elevate) the swollen body part above the level of your  heart when you are sitting or lying down.  Follow your doctor's instructions about diet and how much fluid you can drink (fluid restriction). This information is not intended to replace advice given to you by your health care provider. Make sure you discuss any questions you have with your health care provider. Document Released: 11/15/2007 Document Revised: 06/16/2016 Document Reviewed: 06/16/2016 Elsevier Interactive Patient Education  2017 Elsevier Inc.  

## 2018-01-02 ENCOUNTER — Ambulatory Visit (INDEPENDENT_AMBULATORY_CARE_PROVIDER_SITE_OTHER): Payer: BLUE CROSS/BLUE SHIELD | Admitting: Orthopaedic Surgery

## 2018-01-02 ENCOUNTER — Other Ambulatory Visit: Payer: Self-pay | Admitting: Physician Assistant

## 2018-01-02 DIAGNOSIS — E039 Hypothyroidism, unspecified: Secondary | ICD-10-CM | POA: Insufficient documentation

## 2018-01-02 LAB — CMP14+EGFR
ALT: 20 IU/L (ref 0–32)
AST: 15 IU/L (ref 0–40)
Albumin/Globulin Ratio: 1.2 (ref 1.2–2.2)
Albumin: 3.8 g/dL (ref 3.5–5.5)
Alkaline Phosphatase: 80 IU/L (ref 39–117)
BUN/Creatinine Ratio: 12 (ref 9–23)
BUN: 9 mg/dL (ref 6–24)
Bilirubin Total: <0.2 mg/dL (ref 0.0–1.2)
CO2: 22 mmol/L (ref 20–29)
Calcium: 9.1 mg/dL (ref 8.7–10.2)
Chloride: 102 mmol/L (ref 96–106)
Creatinine, Ser: 0.77 mg/dL (ref 0.57–1.00)
GFR calc Af Amer: 106 mL/min/{1.73_m2} (ref >59)
GFR calc non Af Amer: 92 mL/min/{1.73_m2} (ref >59)
Globulin, Total: 3.1 g/dL (ref 1.5–4.5)
Glucose: 95 mg/dL (ref 65–99)
Potassium: 4.4 mmol/L (ref 3.5–5.2)
Sodium: 139 mmol/L (ref 134–144)
Total Protein: 6.9 g/dL (ref 6.0–8.5)

## 2018-01-02 LAB — CBC WITH DIFFERENTIAL/PLATELET
Basophils Absolute: 0.1 10*3/uL (ref 0.0–0.2)
Basos: 1 %
EOS (ABSOLUTE): 0.6 10*3/uL — ABNORMAL HIGH (ref 0.0–0.4)
Eos: 7 %
Hematocrit: 36.1 % (ref 34.0–46.6)
Hemoglobin: 11.5 g/dL (ref 11.1–15.9)
Immature Grans (Abs): 0 10*3/uL (ref 0.0–0.1)
Immature Granulocytes: 0 %
Lymphocytes Absolute: 1.7 10*3/uL (ref 0.7–3.1)
Lymphs: 23 %
MCH: 24.7 pg — ABNORMAL LOW (ref 26.6–33.0)
MCHC: 31.9 g/dL (ref 31.5–35.7)
MCV: 78 fL — ABNORMAL LOW (ref 79–97)
Monocytes Absolute: 0.7 10*3/uL (ref 0.1–0.9)
Monocytes: 10 %
Neutrophils Absolute: 4.5 10*3/uL (ref 1.4–7.0)
Neutrophils: 59 %
Platelets: 345 10*3/uL (ref 150–450)
RBC: 4.66 x10E6/uL (ref 3.77–5.28)
RDW: 15.2 % (ref 12.3–15.4)
WBC: 7.6 10*3/uL (ref 3.4–10.8)

## 2018-01-02 LAB — LIPID PANEL
Chol/HDL Ratio: 4.3 ratio (ref 0.0–4.4)
Cholesterol, Total: 182 mg/dL (ref 100–199)
HDL: 42 mg/dL (ref >39)
LDL Calculated: 96 mg/dL (ref 0–99)
Triglycerides: 221 mg/dL — ABNORMAL HIGH (ref 0–149)
VLDL Cholesterol Cal: 44 mg/dL — ABNORMAL HIGH (ref 5–40)

## 2018-01-02 LAB — THYROID PANEL WITH TSH
Free Thyroxine Index: 0.9 — ABNORMAL LOW (ref 1.2–4.9)
T3 Uptake Ratio: 21 % — ABNORMAL LOW (ref 24–39)
T4, Total: 4.5 ug/dL (ref 4.5–12.0)
TSH: 15.27 u[IU]/mL — ABNORMAL HIGH (ref 0.450–4.500)

## 2018-01-02 MED ORDER — LEVOTHYROXINE SODIUM 50 MCG PO TABS: 50.0000 ug | ORAL_TABLET | Freq: Every day | ORAL | 2 refills | Status: DC

## 2018-01-02 NOTE — Progress Notes (Signed)
BP 113/75   Pulse 82   Temp 97.6 F (36.4 C) (Oral)   Ht '5\' 4"'  (1.626 m)   Wt 194 lb (88 kg)   BMI 33.30 kg/m    Subjective:    Patient ID: Rebecca Pham, female    DOB: 1971/05/31, 47 y.o.   MRN: [id number]  HPI: Rebecca Pham is a 47 y.o. female presenting on 01/01/2018 for thyroid check; Weight Gain; Fatigue; and urine leakage  This patient comes in with multiple list of complaints.  Her primary complaint is fatigue, weight gain.  And she never feels well rested.  Her husband states that she has had apnea episodes and does snore quite loudly.  She is never been tested for sleep apnea.  She has never had problems with her thyroid in the past.  Patient also had an abnormality of the MRI performed through her workplace.  There was something in her right breast that needed further examination.  We will get these results and set up the secondary testing for her.  She also is having significant leakage from her bladder anytime she has much movement, jumping, coughing, lifting.  She has had a hysterectomy in the past.  Past Medical History:  Diagnosis Date  . Asthma   . GERD (gastroesophageal reflux disease)    Relevant past medical, surgical, family and social history reviewed and updated as indicated. Interim medical history since our last visit reviewed. Allergies and medications reviewed and updated. DATA REVIEWED: CHART IN EPIC  Family History reviewed for pertinent findings.  Review of Systems  Constitutional: Positive for fatigue and unexpected weight change. Negative for activity change, chills, diaphoresis and fever.  HENT: Negative.   Eyes: Negative.   Respiratory: Negative.  Negative for cough, shortness of breath and wheezing.   Cardiovascular: Negative.  Negative for chest pain.  Gastrointestinal: Negative.  Negative for abdominal pain.  Endocrine: Negative.   Genitourinary: Positive for enuresis, frequency and urgency. Negative for difficulty  urinating, dysuria, menstrual problem and pelvic pain.  Musculoskeletal: Negative.   Skin: Negative.   Neurological: Positive for weakness.  Psychiatric/Behavioral: Positive for sleep disturbance.    Allergies as of 01/01/2018      Reactions   Rebecca [aspirin] Swelling      Medication List        Accurate as of 01/01/18 11:59 PM. Always use your most recent med list.          ALPRAZolam 0.25 MG tablet Commonly known as:  XANAX Take 1 tablet (0.25 mg total) by mouth 2 (two) times daily as needed for anxiety.   citalopram 20 MG tablet Commonly known as:  CELEXA Take 0.5-1 tablets (10-20 mg total) by mouth daily.   loratadine 10 MG tablet Commonly known as:  CLARITIN Take 1 tablet (10 mg total) by mouth daily.   omeprazole 20 MG capsule Commonly known as:  PRILOSEC Take 1 capsule (20 mg total) by mouth daily.   oxyCODONE-acetaminophen 5-325 MG tablet Commonly known as:  PERCOCET/ROXICET   predniSONE 20 MG tablet Commonly known as:  DELTASONE   PROAIR HFA 108 (90 Base) MCG/ACT inhaler Generic drug:  albuterol          Objective:    BP 113/75   Pulse 82   Temp 97.6 F (36.4 C) (Oral)   Ht '5\' 4"'  (1.626 m)   Wt 194 lb (88 kg)   BMI 33.30 kg/m   Allergies  Allergen Reactions  . Rebecca [Aspirin] Swelling  Wt Readings from Last 3 Encounters:  01/01/18 194 lb (88 kg)  08/09/17 188 lb (85.3 kg)  07/31/17 188 lb (85.3 kg)    Physical Exam  Constitutional: She is oriented to person, place, and time. She appears well-developed and well-nourished.  HENT:  Head: Normocephalic and atraumatic.  Right Ear: Tympanic membrane, external ear and ear canal normal.  Left Ear: Tympanic membrane, external ear and ear canal normal.  Nose: Nose normal. No rhinorrhea.  Mouth/Throat: Oropharynx is clear and moist and mucous membranes are normal. No oropharyngeal exudate or posterior oropharyngeal erythema.  Eyes: Pupils are equal, round, and reactive to light. Conjunctivae and  EOM are normal.  Neck: Normal range of motion. Neck supple.  Cardiovascular: Normal rate, regular rhythm, normal heart sounds and intact distal pulses.  Pulmonary/Chest: Effort normal and breath sounds normal.  Abdominal: Soft. Bowel sounds are normal.  Neurological: She is alert and oriented to person, place, and time. She has normal reflexes.  Skin: Skin is warm and dry. No rash noted.  Psychiatric: She has a normal mood and affect. Her behavior is normal. Judgment and thought content normal.    Results for orders placed or performed in visit on 01/01/18  Microscopic Examination  Result Value Ref Range   WBC, UA 6-10 (A) 0 - 5 /hpf   RBC, UA 3-10 (A) 0 - 2 /hpf   Epithelial Cells (non renal) >10 (A) 0 - 10 /hpf   Renal Epithel, UA None seen None seen /hpf   Bacteria, UA Few None seen/Few  CBC with Differential/Platelet  Result Value Ref Range   WBC 7.6 3.4 - 10.8 x10E3/uL   RBC 4.66 3.77 - 5.28 x10E6/uL   Hemoglobin 11.5 11.1 - 15.9 g/dL   Hematocrit 36.1 34.0 - 46.6 %   MCV 78 (L) 79 - 97 fL   MCH 24.7 (L) 26.6 - 33.0 pg   MCHC 31.9 31.5 - 35.7 g/dL   RDW 15.2 12.3 - 15.4 %   Platelets 345 150 - 450 x10E3/uL   Neutrophils 59 Not Estab. %   Lymphs 23 Not Estab. %   Monocytes 10 Not Estab. %   Eos 7 Not Estab. %   Basos 1 Not Estab. %   Neutrophils Absolute 4.5 1.4 - 7.0 x10E3/uL   Lymphocytes Absolute 1.7 0.7 - 3.1 x10E3/uL   Monocytes Absolute 0.7 0.1 - 0.9 x10E3/uL   EOS (ABSOLUTE) 0.6 (H) 0.0 - 0.4 x10E3/uL   Basophils Absolute 0.1 0.0 - 0.2 x10E3/uL   Immature Granulocytes 0 Not Estab. %   Immature Grans (Abs) 0.0 0.0 - 0.1 x10E3/uL  CMP14+EGFR  Result Value Ref Range   Glucose 95 65 - 99 mg/dL   BUN 9 6 - 24 mg/dL   Creatinine, Ser 0.77 0.57 - 1.00 mg/dL   GFR calc non Af Amer 92 >59 mL/min/1.73   GFR calc Af Amer 106 >59 mL/min/1.73   BUN/Creatinine Ratio 12 9 - 23   Sodium 139 134 - 144 mmol/L   Potassium 4.4 3.5 - 5.2 mmol/L   Chloride 102 96 - 106 mmol/L     CO2 22 20 - 29 mmol/L   Calcium 9.1 8.7 - 10.2 mg/dL   Total Protein 6.9 6.0 - 8.5 g/dL   Albumin 3.8 3.5 - 5.5 g/dL   Globulin, Total 3.1 1.5 - 4.5 g/dL   Albumin/Globulin Ratio 1.2 1.2 - 2.2   Bilirubin Total <0.2 0.0 - 1.2 mg/dL   Alkaline Phosphatase 80 39 - 117 IU/L  AST 15 0 - 40 IU/L   ALT 20 0 - 32 IU/L  Lipid panel  Result Value Ref Range   Cholesterol, Total 182 100 - 199 mg/dL   Triglycerides 221 (H) 0 - 149 mg/dL   HDL 42 >39 mg/dL   VLDL Cholesterol Cal 44 (H) 5 - 40 mg/dL   LDL Calculated 96 0 - 99 mg/dL   Chol/HDL Ratio 4.3 0.0 - 4.4 ratio  Thyroid Panel With TSH  Result Value Ref Range   TSH 15.270 (H) 0.450 - 4.500 uIU/mL   T4, Total 4.5 4.5 - 12.0 ug/dL   T3 Uptake Ratio 21 (L) 24 - 39 %   Free Thyroxine Index 0.9 (L) 1.2 - 4.9  Urinalysis, Complete  Result Value Ref Range   Specific Gravity, UA 1.015 1.005 - 1.030   pH, UA 7.0 5.0 - 7.5   Color, UA Yellow Yellow   Appearance Ur Clear Clear   Leukocytes, UA 2+ (A) Negative   Protein, UA Negative Negative/Trace   Glucose, UA Negative Negative   Ketones, UA Negative Negative   RBC, UA Trace (A) Negative   Bilirubin, UA Negative Negative   Urobilinogen, Ur 0.2 0.2 - 1.0 mg/dL   Nitrite, UA Negative Negative   Microscopic Examination See below:       Assessment & Plan:   1. Stress incontinence - Ambulatory referral to Urology  2. Fatigue, unspecified type - CBC with Differential/Platelet - CMP14+EGFR - Lipid panel - Thyroid Panel With TSH - Ambulatory referral to Sleep Studies  3. Weight gain - CBC with Differential/Platelet - CMP14+EGFR - Lipid panel - Thyroid Panel With TSH  4. Edema, unspecified type - CMP14+EGFR  5. Depression, major, single episode, moderate ([place])  6. GAD (generalized anxiety disorder) - ALPRAZolam (XANAX) 0.25 MG tablet; Take 1 tablet (0.25 mg total) by mouth 2 (two) times daily as needed for anxiety.  Dispense: 30 tablet; Refill: 2  7. Abnormal  mammogram  8. Witnessed episode of apnea - Ambulatory referral to Sleep Studies  9. Somnolence - Ambulatory referral to Sleep Studies  10. Dysuria - Urine Culture; Future - Urinalysis, Complete   Continue all other maintenance medications as listed above.  Follow up plan: Return in about 6 weeks (around 02/12/2018) for recheck.  Educational handout given for [place] PA-C [facility] [address]  [place], [place] [address] [phone]   01/02/2018, 11:36 AM

## 2018-01-19 ENCOUNTER — Other Ambulatory Visit: Payer: Self-pay | Admitting: Physician Assistant

## 2018-01-19 DIAGNOSIS — K219 Gastro-esophageal reflux disease without esophagitis: Secondary | ICD-10-CM

## 2018-01-22 ENCOUNTER — Telehealth: Payer: Self-pay | Admitting: Physician Assistant

## 2018-01-22 NOTE — Telephone Encounter (Signed)
Please look into this

## 2018-02-06 DIAGNOSIS — Z7689 Persons encountering health services in other specified circumstances: Secondary | ICD-10-CM | POA: Diagnosis not present

## 2018-02-06 DIAGNOSIS — E669 Obesity, unspecified: Secondary | ICD-10-CM | POA: Diagnosis not present

## 2018-02-06 DIAGNOSIS — R609 Edema, unspecified: Secondary | ICD-10-CM | POA: Diagnosis not present

## 2018-02-06 DIAGNOSIS — R0683 Snoring: Secondary | ICD-10-CM | POA: Diagnosis not present

## 2018-02-08 ENCOUNTER — Ambulatory Visit: Payer: BLUE CROSS/BLUE SHIELD | Admitting: Physician Assistant

## 2018-02-19 ENCOUNTER — Ambulatory Visit: Payer: Self-pay | Admitting: Urology

## 2018-02-20 ENCOUNTER — Other Ambulatory Visit: Payer: Self-pay | Admitting: Physician Assistant

## 2018-02-20 DIAGNOSIS — L239 Allergic contact dermatitis, unspecified cause: Secondary | ICD-10-CM

## 2018-02-21 NOTE — Telephone Encounter (Signed)
Pt was scheduled for 01/08/2018 at [facility]; Pt did not go to this appointment and wants to be rescheduled for October 30,2019 at the [facility] in [place]. Films have been requested and and an appointment has been made for 04/10/18 at 9:00. Letter sent to pt with this information

## 2018-02-22 ENCOUNTER — Other Ambulatory Visit: Payer: Self-pay | Admitting: Physician Assistant

## 2018-02-22 DIAGNOSIS — L239 Allergic contact dermatitis, unspecified cause: Secondary | ICD-10-CM

## 2018-02-24 ENCOUNTER — Other Ambulatory Visit: Payer: Self-pay | Admitting: Physician Assistant

## 2018-02-24 DIAGNOSIS — L239 Allergic contact dermatitis, unspecified cause: Secondary | ICD-10-CM

## 2018-02-27 ENCOUNTER — Other Ambulatory Visit: Payer: Self-pay | Admitting: Physician Assistant

## 2018-02-27 ENCOUNTER — Institutional Professional Consult (permissible substitution): Payer: BLUE CROSS/BLUE SHIELD | Admitting: Neurology

## 2018-02-27 DIAGNOSIS — L239 Allergic contact dermatitis, unspecified cause: Secondary | ICD-10-CM

## 2018-03-28 ENCOUNTER — Other Ambulatory Visit: Payer: Self-pay | Admitting: Physician Assistant

## 2018-03-28 DIAGNOSIS — L239 Allergic contact dermatitis, unspecified cause: Secondary | ICD-10-CM

## 2018-04-10 DIAGNOSIS — R922 Inconclusive mammogram: Secondary | ICD-10-CM | POA: Diagnosis not present

## 2018-04-10 DIAGNOSIS — N6489 Other specified disorders of breast: Secondary | ICD-10-CM | POA: Diagnosis not present

## 2018-04-10 LAB — HM MAMMOGRAPHY

## 2018-05-05 DIAGNOSIS — J4521 Mild intermittent asthma with (acute) exacerbation: Secondary | ICD-10-CM | POA: Diagnosis not present

## 2018-05-05 DIAGNOSIS — Z79899 Other long term (current) drug therapy: Secondary | ICD-10-CM | POA: Diagnosis not present

## 2018-05-05 DIAGNOSIS — J029 Acute pharyngitis, unspecified: Secondary | ICD-10-CM | POA: Diagnosis not present

## 2018-05-05 DIAGNOSIS — R05 Cough: Secondary | ICD-10-CM | POA: Diagnosis not present

## 2018-07-20 ENCOUNTER — Other Ambulatory Visit: Payer: Self-pay | Admitting: Physician Assistant

## 2018-07-20 DIAGNOSIS — K219 Gastro-esophageal reflux disease without esophagitis: Secondary | ICD-10-CM

## 2018-08-20 ENCOUNTER — Ambulatory Visit: Payer: BLUE CROSS/BLUE SHIELD | Admitting: Nurse Practitioner

## 2018-08-22 ENCOUNTER — Other Ambulatory Visit: Payer: Self-pay | Admitting: *Deleted

## 2018-08-22 ENCOUNTER — Ambulatory Visit (INDEPENDENT_AMBULATORY_CARE_PROVIDER_SITE_OTHER): Payer: BLUE CROSS/BLUE SHIELD

## 2018-08-22 ENCOUNTER — Other Ambulatory Visit: Payer: Self-pay

## 2018-08-22 ENCOUNTER — Encounter (INDEPENDENT_AMBULATORY_CARE_PROVIDER_SITE_OTHER): Payer: Self-pay | Admitting: Orthopaedic Surgery

## 2018-08-22 ENCOUNTER — Ambulatory Visit (INDEPENDENT_AMBULATORY_CARE_PROVIDER_SITE_OTHER): Payer: BLUE CROSS/BLUE SHIELD | Admitting: Orthopaedic Surgery

## 2018-08-22 VITALS — BP 138/98 | HR 94 | Ht 64.0 in | Wt 194.0 lb

## 2018-08-22 DIAGNOSIS — M25522 Pain in left elbow: Secondary | ICD-10-CM

## 2018-08-22 DIAGNOSIS — K219 Gastro-esophageal reflux disease without esophagitis: Secondary | ICD-10-CM

## 2018-08-22 DIAGNOSIS — M7712 Lateral epicondylitis, left elbow: Secondary | ICD-10-CM | POA: Diagnosis not present

## 2018-08-22 MED ORDER — OMEPRAZOLE 20 MG PO CPDR: 20.0000 mg | DELAYED_RELEASE_CAPSULE | Freq: Every day | ORAL | 0 refills | Status: DC

## 2018-08-22 NOTE — Telephone Encounter (Signed)
OV 09/06/18

## 2018-08-22 NOTE — Progress Notes (Signed)
Office Visit Note   Patient: Rebecca Pham           Date of Birth: 12/05/1970           MRN: [id number] Visit Date: 08/22/2018              Requested by: Remus Loffler, PA-C [address] [place], [place] [address] PCP: Remus Loffler, PA-C   Assessment & Plan: Visit Diagnoses:  1. Pain in left elbow   2. Lateral epicondylitis, left elbow     Plan: We discussed pathophysiology of condition reviewed x-rays.  Tennis elbow brace applied appropriate use discussed.  Tennis elbow injection performed.  She does work till Monday she will call if she needs a note.  Hopefully her symptoms will resolve.  Office follow-up if needed.  She has persistent symptoms we will consider diagnostic MRI imaging of the left elbow.  Follow-Up Instructions: No follow-ups on file.   Orders:  Orders Placed This Encounter  Procedures  . XR Elbow 2 Views Left   No orders of the defined types were placed in this encounter.     Procedures: No procedures performed   Clinical Data: No additional findings.   Subjective: Chief Complaint  Patient presents with  . 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  Her elbow gets stiff she still had full extension she is used Tylenol and states over the last week the pain is been worse.  Denies any associated neck pain no numbness or tingling in her fingers.  Patient is allergic to aspirin.  Review of Systems 14 point review of systems updated she does have some asthma depression history of reflux hypothyroidism otherwise negative is obtains HPI.   Objective: Vital Signs: BP (!) 138/98   Pulse 94   Ht 5\' 4"  (1.626 m)   Wt 194 lb (88 kg)   BMI 33.30 kg/m   Physical Exam Constitutional:      Appearance: She  is well-developed.  HENT:     Head: Normocephalic.     Right Ear: External ear normal.     Left Ear: External ear normal.  Eyes:     Pupils: Pupils are equal, round, and reactive to light.  Neck:     Thyroid: No thyromegaly.     Trachea: No tracheal deviation.  Cardiovascular:     Rate and Rhythm: Normal rate.  Pulmonary:     Effort: Pulmonary effort is normal.  Abdominal:     Palpations: Abdomen is soft.  Skin:    General: Skin is warm and dry.  Neurological:     Mental Status: She is alert and oriented to person, place, and time.  Psychiatric:        Behavior: Behavior normal.     Ortho Exam patient has good cervical range of motion negative Spurling no brachial plexus tenderness negative pain with compression no change with distraction shoulder shows no instability.  Opposite right elbow shows full range of motion no tenderness.  Left elbow shows normal ulnar nerve medial epicondyle is not tender.  Lateral epicondyles tender pain with resisted wrist extension and gripping.  No atrophy no palpable defect.  Specialty Comments:  No  specialty comments available.  Imaging: Xr Elbow 2 Views Left  Result Date: 08/23/2018 AP lateral left elbow obtained and reviewed.  This shows normal bone anatomy no degenerative changes no soft tissue swelling no elbow effusion.  No calcification at the lateral epicondyles. Impression: Normal left elbow x-rays.    PMFS History: Patient Active Problem List   Diagnosis Date Noted  . Hypothyroid 01/02/2018  . Stress incontinence 01/01/2018  . Abnormal mammogram 01/01/2018  . ASCUS with positive high risk HPV cervical 03/05/2017  . Gastroesophageal reflux disease without esophagitis 01/31/2017  . Mild intermittent asthma without complication 01/31/2017  . Depression, major, single episode, moderate (HCC) 01/31/2017  . GAD (generalized anxiety disorder) 01/31/2017  . Allergic dermatitis 01/31/2017  . Hives 01/31/2017   Past Medical History:   Diagnosis Date  . Asthma   . GERD (gastroesophageal reflux disease)     Family History  Problem Relation Age of Onset  . COPD Father   . Arthritis Father   . Cancer Maternal Grandmother        lung  . Vision loss Paternal Grandfather   . Diabetes Paternal Grandfather   . Heart disease Paternal Grandfather     Past Surgical History:  Procedure Laterality Date  . KNEE SURGERY  1997  . TUBAL LIGATION     Social History   Occupational History  . Not on file  Tobacco Use  . Smoking status: Never Smoker  . Smokeless tobacco: Never Used  Substance and Sexual Activity  . Alcohol use: Yes    Comment: occ  . Drug use: No  . Sexual activity: Yes    Birth control/protection: Surgical

## 2018-08-23 ENCOUNTER — Encounter (INDEPENDENT_AMBULATORY_CARE_PROVIDER_SITE_OTHER): Payer: Self-pay | Admitting: Orthopaedic Surgery

## 2018-08-23 DIAGNOSIS — M7712 Lateral epicondylitis, left elbow: Secondary | ICD-10-CM | POA: Insufficient documentation

## 2018-09-05 ENCOUNTER — Other Ambulatory Visit: Payer: Self-pay

## 2018-09-06 ENCOUNTER — Encounter: Payer: Self-pay | Admitting: Physician Assistant

## 2018-09-06 ENCOUNTER — Ambulatory Visit: Payer: BLUE CROSS/BLUE SHIELD | Admitting: Physician Assistant

## 2018-09-06 VITALS — BP 128/86 | HR 89 | Temp 97.6°F | Ht 64.0 in | Wt 202.0 lb

## 2018-09-06 DIAGNOSIS — F411 Generalized anxiety disorder: Secondary | ICD-10-CM

## 2018-09-06 DIAGNOSIS — M5442 Lumbago with sciatica, left side: Secondary | ICD-10-CM | POA: Diagnosis not present

## 2018-09-06 DIAGNOSIS — L239 Allergic contact dermatitis, unspecified cause: Secondary | ICD-10-CM

## 2018-09-06 DIAGNOSIS — J4520 Mild intermittent asthma, uncomplicated: Secondary | ICD-10-CM

## 2018-09-06 DIAGNOSIS — Z0000 Encounter for general adult medical examination without abnormal findings: Secondary | ICD-10-CM | POA: Diagnosis not present

## 2018-09-06 DIAGNOSIS — E039 Hypothyroidism, unspecified: Secondary | ICD-10-CM

## 2018-09-06 DIAGNOSIS — M5441 Lumbago with sciatica, right side: Secondary | ICD-10-CM

## 2018-09-06 DIAGNOSIS — G8929 Other chronic pain: Secondary | ICD-10-CM

## 2018-09-06 DIAGNOSIS — F321 Major depressive disorder, single episode, moderate: Secondary | ICD-10-CM

## 2018-09-06 MED ORDER — PANTOPRAZOLE SODIUM 40 MG PO TBEC: 40.0000 mg | DELAYED_RELEASE_TABLET | Freq: Every day | ORAL | 5 refills | Status: DC

## 2018-09-06 MED ORDER — BUDESONIDE-FORMOTEROL FUMARATE 80-4.5 MCG/ACT IN AERO: 2.0000 | INHALATION_SPRAY | Freq: Two times a day (BID) | RESPIRATORY_TRACT | 3 refills | Status: DC

## 2018-09-06 MED ORDER — LEVOTHYROXINE SODIUM 50 MCG PO TABS: 50.0000 ug | ORAL_TABLET | Freq: Every day | ORAL | 2 refills | Status: DC

## 2018-09-06 MED ORDER — CITALOPRAM HYDROBROMIDE 20 MG PO TABS: 10.0000 mg | ORAL_TABLET | Freq: Every day | ORAL | 5 refills | Status: DC

## 2018-09-06 MED ORDER — ALPRAZOLAM 0.25 MG PO TABS: 0.2500 mg | ORAL_TABLET | Freq: Two times a day (BID) | ORAL | 2 refills | Status: DC | PRN

## 2018-09-06 MED ORDER — LORATADINE 10 MG PO TABS: 10.0000 mg | ORAL_TABLET | Freq: Every day | ORAL | 11 refills | Status: DC

## 2018-09-06 MED ORDER — OXYBUTYNIN CHLORIDE ER 15 MG PO TB24: 15.0000 mg | ORAL_TABLET | Freq: Every day | ORAL | 5 refills | Status: DC

## 2018-09-06 MED ORDER — PROAIR HFA 108 (90 BASE) MCG/ACT IN AERS: 2.0000 | INHALATION_SPRAY | Freq: Four times a day (QID) | RESPIRATORY_TRACT | 2 refills | Status: DC | PRN

## 2018-09-06 NOTE — Patient Instructions (Signed)

## 2018-09-07 LAB — CBC WITH DIFFERENTIAL/PLATELET
Basophils Absolute: 0.1 10*3/uL (ref 0.0–0.2)
Basos: 1 %
EOS (ABSOLUTE): 0.6 10*3/uL — ABNORMAL HIGH (ref 0.0–0.4)
Eos: 7 %
Hematocrit: 35.7 % (ref 34.0–46.6)
Hemoglobin: 11.3 g/dL (ref 11.1–15.9)
Immature Grans (Abs): 0 10*3/uL (ref 0.0–0.1)
Immature Granulocytes: 0 %
Lymphocytes Absolute: 2.3 10*3/uL (ref 0.7–3.1)
Lymphs: 26 %
MCH: 26 pg — ABNORMAL LOW (ref 26.6–33.0)
MCHC: 31.7 g/dL (ref 31.5–35.7)
MCV: 82 fL (ref 79–97)
Monocytes Absolute: 0.7 10*3/uL (ref 0.1–0.9)
Monocytes: 8 %
Neutrophils Absolute: 5 10*3/uL (ref 1.4–7.0)
Neutrophils: 58 %
Platelets: 348 10*3/uL (ref 150–450)
RBC: 4.35 x10E6/uL (ref 3.77–5.28)
RDW: 13.8 % (ref 11.7–15.4)
WBC: 8.6 10*3/uL (ref 3.4–10.8)

## 2018-09-07 LAB — CMP14+EGFR
ALT: 19 IU/L (ref 0–32)
AST: 24 IU/L (ref 0–40)
Albumin/Globulin Ratio: 1.5 (ref 1.2–2.2)
Albumin: 4 g/dL (ref 3.8–4.8)
Alkaline Phosphatase: 88 IU/L (ref 39–117)
BUN/Creatinine Ratio: 12 (ref 9–23)
BUN: 12 mg/dL (ref 6–24)
Bilirubin Total: 0.2 mg/dL (ref 0.0–1.2)
CO2: 23 mmol/L (ref 20–29)
Calcium: 8.8 mg/dL (ref 8.7–10.2)
Chloride: 101 mmol/L (ref 96–106)
Creatinine, Ser: 1 mg/dL (ref 0.57–1.00)
GFR calc Af Amer: 76 mL/min/{1.73_m2} (ref >59)
GFR calc non Af Amer: 66 mL/min/{1.73_m2} (ref >59)
Globulin, Total: 2.7 g/dL (ref 1.5–4.5)
Glucose: 102 mg/dL — ABNORMAL HIGH (ref 65–99)
Potassium: 4.3 mmol/L (ref 3.5–5.2)
Sodium: 140 mmol/L (ref 134–144)
Total Protein: 6.7 g/dL (ref 6.0–8.5)

## 2018-09-07 LAB — LIPID PANEL
Chol/HDL Ratio: 4.4 ratio (ref 0.0–4.4)
Cholesterol, Total: 219 mg/dL — ABNORMAL HIGH (ref 100–199)
HDL: 50 mg/dL (ref >39)
LDL Calculated: 146 mg/dL — ABNORMAL HIGH (ref 0–99)
Triglycerides: 113 mg/dL (ref 0–149)
VLDL Cholesterol Cal: 23 mg/dL (ref 5–40)

## 2018-09-07 LAB — TSH: TSH: 110.8 u[IU]/mL — ABNORMAL HIGH (ref 0.450–4.500)

## 2018-09-08 NOTE — Progress Notes (Signed)
BP 128/86   Pulse 89   Temp 97.6 F (36.4 C) (Oral)   Ht '5\' 4"'  (1.626 m)   Wt 202 lb (91.6 kg)   BMI 34.67 kg/m    Subjective:    Patient ID: Rebecca Pham, female    DOB: 07/27/1970, 48 y.o.   MRN: [id number]  HPI: Rebecca Pham is a 48 y.o. female presenting on 09/06/2018 for No chief complaint on file.  This patient comes in for periodic recheck on medications and conditions including mild intermittent asthma, allergic rhinitis, chronic back pain that recurs, depression, generalized anxiety, hypothyroidism.  Patient has not been in some time due to insurance changes.  After she is getting back into our office today and would like to have her medications refilled.  She has not had well labs performed.  We will get those today.  She has been feeling quite tired overall.  She has very little energy.  She does have more GERD, more joint pain, or trouble sleeping.  She also is having more issues with overactive bladder.  She was not able to go to urology because of insurance.  She would like to start with the medication first if possible..   All medications are reviewed today. There are no reports of any problems with the medications. All of the medical conditions are reviewed and updated.  Lab work is reviewed and will be ordered as medically necessary. There are no new problems reported with today's visit.   Past Medical History:  Diagnosis Date  . Asthma   . GERD (gastroesophageal reflux disease)    Relevant past medical, surgical, family and social history reviewed and updated as indicated. Interim medical history since our last visit reviewed. Allergies and medications reviewed and updated. DATA REVIEWED: CHART IN EPIC  Family History reviewed for pertinent findings.  Review of Systems  Constitutional: Positive for fatigue and unexpected weight change. Negative for activity change and fever.  HENT: Negative.   Eyes: Negative.   Respiratory: Negative.  Negative  for cough, shortness of breath and wheezing.   Cardiovascular: Negative.  Negative for chest pain, palpitations and leg swelling.  Gastrointestinal: Negative.  Negative for abdominal pain.  Endocrine: Positive for cold intolerance.  Genitourinary: Positive for frequency and urgency. Negative for dysuria and pelvic pain.  Musculoskeletal: Positive for arthralgias and myalgias.  Skin: Negative.   Neurological: Negative.  Negative for syncope and weakness.    Allergies as of 09/06/2018      Reactions   Rebecca [aspirin] Swelling      Medication List       Accurate as of September 06, 2018 11:59 PM. Always use your most recent med list.        ALPRAZolam 0.25 MG tablet Commonly known as:  XANAX Take 1 tablet (0.25 mg total) by mouth 2 (two) times daily as needed for anxiety.   budesonide-formoterol 80-4.5 MCG/ACT inhaler Commonly known as:  SYMBICORT Inhale 2 puffs into the lungs 2 (two) times daily.   citalopram 20 MG tablet Commonly known as:  CeleXA Take 0.5-1 tablets (10-20 mg total) by mouth daily.   levothyroxine 50 MCG tablet Commonly known as:  SYNTHROID, LEVOTHROID Take 1 tablet (50 mcg total) by mouth daily.   loratadine 10 MG tablet Commonly known as:  CLARITIN Take 1 tablet (10 mg total) by mouth daily.   oxybutynin 15 MG 24 hr tablet Commonly known as:  Ditropan XL Take 1 tablet (15 mg total) by mouth at bedtime.  pantoprazole 40 MG tablet Commonly known as:  PROTONIX Take 1 tablet (40 mg total) by mouth daily.   ProAir HFA 108 (90 Base) MCG/ACT inhaler Generic drug:  albuterol Inhale 2 puffs into the lungs every 6 (six) hours as needed for wheezing or shortness of breath.          Objective:    BP 128/86   Pulse 89   Temp 97.6 F (36.4 C) (Oral)   Ht '5\' 4"'  (1.626 m)   Wt 202 lb (91.6 kg)   BMI 34.67 kg/m   Allergies  Allergen Reactions  . Rebecca [Aspirin] Swelling    Wt Readings from Last 3 Encounters:  09/06/18 202 lb (91.6 kg)  08/22/18 194  lb (88 kg)  01/01/18 194 lb (88 kg)    Physical Exam Constitutional:      Appearance: She is well-developed.  HENT:     Head: Normocephalic and atraumatic.     Right Ear: Drainage and tenderness present.     Left Ear: Drainage and tenderness present.     Nose: Mucosal edema and rhinorrhea present.     Right Sinus: No maxillary sinus tenderness or frontal sinus tenderness.     Left Sinus: No maxillary sinus tenderness or frontal sinus tenderness.     Mouth/Throat:     Pharynx: Oropharyngeal exudate and posterior oropharyngeal erythema present.  Eyes:     Conjunctiva/sclera: Conjunctivae normal.     Pupils: Pupils are equal, round, and reactive to light.  Neck:     Musculoskeletal: Normal range of motion and neck supple.  Cardiovascular:     Rate and Rhythm: Normal rate and regular rhythm.     Heart sounds: Normal heart sounds.  Pulmonary:     Effort: Pulmonary effort is normal.     Breath sounds: Examination of the right-upper field reveals wheezing. Examination of the left-upper field reveals wheezing. Wheezing present.  Abdominal:     General: Bowel sounds are normal.     Palpations: Abdomen is soft.  Skin:    General: Skin is warm and dry.     Findings: No rash.  Neurological:     Mental Status: She is alert and oriented to person, place, and time.     Deep Tendon Reflexes: Reflexes are normal and symmetric.  Psychiatric:        Behavior: Behavior normal.        Thought Content: Thought content normal.        Judgment: Judgment normal.     Results for orders placed or performed in visit on 09/06/18  CBC with Differential/Platelet  Result Value Ref Range   WBC 8.6 3.4 - 10.8 x10E3/uL   RBC 4.35 3.77 - 5.28 x10E6/uL   Hemoglobin 11.3 11.1 - 15.9 g/dL   Hematocrit 35.7 34.0 - 46.6 %   MCV 82 79 - 97 fL   MCH 26.0 (L) 26.6 - 33.0 pg   MCHC 31.7 31.5 - 35.7 g/dL   RDW 13.8 11.7 - 15.4 %   Platelets 348 150 - 450 x10E3/uL   Neutrophils 58 Not Estab. %   Lymphs 26  Not Estab. %   Monocytes 8 Not Estab. %   Eos 7 Not Estab. %   Basos 1 Not Estab. %   Neutrophils Absolute 5.0 1.4 - 7.0 x10E3/uL   Lymphocytes Absolute 2.3 0.7 - 3.1 x10E3/uL   Monocytes Absolute 0.7 0.1 - 0.9 x10E3/uL   EOS (ABSOLUTE) 0.6 (H) 0.0 - 0.4 x10E3/uL   Basophils Absolute  0.1 0.0 - 0.2 x10E3/uL   Immature Granulocytes 0 Not Estab. %   Immature Grans (Abs) 0.0 0.0 - 0.1 x10E3/uL  CMP14+EGFR  Result Value Ref Range   Glucose 102 (H) 65 - 99 mg/dL   BUN 12 6 - 24 mg/dL   Creatinine, Ser 1.00 0.57 - 1.00 mg/dL   GFR calc non Af Amer 66 >59 mL/min/1.73   GFR calc Af Amer 76 >59 mL/min/1.73   BUN/Creatinine Ratio 12 9 - 23   Sodium 140 134 - 144 mmol/L   Potassium 4.3 3.5 - 5.2 mmol/L   Chloride 101 96 - 106 mmol/L   CO2 23 20 - 29 mmol/L   Calcium 8.8 8.7 - 10.2 mg/dL   Total Protein 6.7 6.0 - 8.5 g/dL   Albumin 4.0 3.8 - 4.8 g/dL   Globulin, Total 2.7 1.5 - 4.5 g/dL   Albumin/Globulin Ratio 1.5 1.2 - 2.2   Bilirubin Total 0.2 0.0 - 1.2 mg/dL   Alkaline Phosphatase 88 39 - 117 IU/L   AST 24 0 - 40 IU/L   ALT 19 0 - 32 IU/L  Lipid panel  Result Value Ref Range   Cholesterol, Total 219 (H) 100 - 199 mg/dL   Triglycerides 113 0 - 149 mg/dL   HDL 50 >39 mg/dL   VLDL Cholesterol Cal 23 5 - 40 mg/dL   LDL Calculated 146 (H) 0 - 99 mg/dL   Chol/HDL Ratio 4.4 0.0 - 4.4 ratio  TSH  Result Value Ref Range   TSH 110.800 (H) 0.450 - 4.500 uIU/mL      Assessment & Plan:   1. Mild intermittent asthma without complication - PROAIR HFA 108 (90 Base) MCG/ACT inhaler; Inhale 2 puffs into the lungs every 6 (six) hours as needed for wheezing or shortness of breath.  Dispense: 1 Inhaler; Refill: 2 - budesonide-formoterol (SYMBICORT) 80-4.5 MCG/ACT inhaler; Inhale 2 puffs into the lungs 2 (two) times daily.  Dispense: 1 Inhaler; Refill: 3  2. Allergic dermatitis - loratadine (CLARITIN) 10 MG tablet; Take 1 tablet (10 mg total) by mouth daily.  Dispense: 30 tablet; Refill: 11   3. Chronic bilateral low back pain with bilateral sciatica  4. Depression, major, single episode, moderate (HCC) - citalopram (CELEXA) 20 MG tablet; Take 0.5-1 tablets (10-20 mg total) by mouth daily.  Dispense: 30 tablet; Refill: 5  5. GAD (generalized anxiety disorder) - ALPRAZolam (XANAX) 0.25 MG tablet; Take 1 tablet (0.25 mg total) by mouth 2 (two) times daily as needed for anxiety.  Dispense: 30 tablet; Refill: 2  6. Acquired hypothyroidism - TSH - levothyroxine (SYNTHROID, LEVOTHROID) 50 MCG tablet; Take 1 tablet (50 mcg total) by mouth daily.  Dispense: 30 tablet; Refill: 2  7. Well adult exam - CBC with Differential/Platelet - CMP14+EGFR - Lipid panel - TSH   Continue all other maintenance medications as listed above.  Follow up plan: Return in about 6 months (around 03/09/2019) for recheck.  Educational handout given for [place] PA-C [place] [address]  [place], [place] [address] [phone]   09/08/2018, 9:07 PM

## 2018-09-10 ENCOUNTER — Other Ambulatory Visit: Payer: Self-pay | Admitting: Physician Assistant

## 2018-09-10 DIAGNOSIS — E039 Hypothyroidism, unspecified: Secondary | ICD-10-CM

## 2018-09-10 MED ORDER — LEVOTHYROXINE SODIUM 50 MCG PO TABS: ORAL_TABLET | ORAL | 1 refills | Status: DC

## 2018-09-19 ENCOUNTER — Other Ambulatory Visit: Payer: Self-pay | Admitting: Physician Assistant

## 2018-09-19 DIAGNOSIS — K219 Gastro-esophageal reflux disease without esophagitis: Secondary | ICD-10-CM

## 2018-09-26 ENCOUNTER — Ambulatory Visit (INDEPENDENT_AMBULATORY_CARE_PROVIDER_SITE_OTHER): Payer: BLUE CROSS/BLUE SHIELD | Admitting: Orthopaedic Surgery

## 2018-09-26 ENCOUNTER — Other Ambulatory Visit: Payer: Self-pay

## 2019-01-09 ENCOUNTER — Other Ambulatory Visit: Payer: Self-pay | Admitting: Physician Assistant

## 2019-01-09 ENCOUNTER — Telehealth: Payer: Self-pay | Admitting: Physician Assistant

## 2019-01-09 DIAGNOSIS — E039 Hypothyroidism, unspecified: Secondary | ICD-10-CM

## 2019-01-09 DIAGNOSIS — Z0000 Encounter for general adult medical examination without abnormal findings: Secondary | ICD-10-CM

## 2019-01-09 DIAGNOSIS — R739 Hyperglycemia, unspecified: Secondary | ICD-10-CM

## 2019-01-09 NOTE — Telephone Encounter (Signed)
Order is placed.

## 2019-01-10 NOTE — Telephone Encounter (Signed)
Patient aware orders are placed

## 2019-01-29 ENCOUNTER — Other Ambulatory Visit: Payer: Self-pay

## 2019-01-29 ENCOUNTER — Other Ambulatory Visit: Payer: BC Managed Care – PPO

## 2019-01-29 DIAGNOSIS — E039 Hypothyroidism, unspecified: Secondary | ICD-10-CM

## 2019-01-29 DIAGNOSIS — Z0000 Encounter for general adult medical examination without abnormal findings: Secondary | ICD-10-CM

## 2019-01-29 DIAGNOSIS — R739 Hyperglycemia, unspecified: Secondary | ICD-10-CM

## 2019-01-29 LAB — BAYER DCA HB A1C WAIVED: HB A1C (BAYER DCA - WAIVED): 5.7 % (ref <7.0)

## 2019-01-30 LAB — CBC WITH DIFFERENTIAL/PLATELET
Basophils Absolute: 0.1 10*3/uL (ref 0.0–0.2)
Basos: 1 %
EOS (ABSOLUTE): 0.5 10*3/uL — ABNORMAL HIGH (ref 0.0–0.4)
Eos: 6 %
Hematocrit: 35.3 % (ref 34.0–46.6)
Hemoglobin: 11.2 g/dL (ref 11.1–15.9)
Immature Grans (Abs): 0 10*3/uL (ref 0.0–0.1)
Immature Granulocytes: 0 %
Lymphocytes Absolute: 2.3 10*3/uL (ref 0.7–3.1)
Lymphs: 30 %
MCH: 24.3 pg — ABNORMAL LOW (ref 26.6–33.0)
MCHC: 31.7 g/dL (ref 31.5–35.7)
MCV: 77 fL — ABNORMAL LOW (ref 79–97)
Monocytes Absolute: 0.7 10*3/uL (ref 0.1–0.9)
Monocytes: 9 %
Neutrophils Absolute: 4.2 10*3/uL (ref 1.4–7.0)
Neutrophils: 54 %
Platelets: 323 10*3/uL (ref 150–450)
RBC: 4.61 x10E6/uL (ref 3.77–5.28)
RDW: 14.8 % (ref 11.7–15.4)
WBC: 7.8 10*3/uL (ref 3.4–10.8)

## 2019-01-30 LAB — LIPID PANEL
Chol/HDL Ratio: 4.8 ratio — ABNORMAL HIGH (ref 0.0–4.4)
Cholesterol, Total: 217 mg/dL — ABNORMAL HIGH (ref 100–199)
HDL: 45 mg/dL (ref >39)
LDL Calculated: 138 mg/dL — ABNORMAL HIGH (ref 0–99)
Triglycerides: 172 mg/dL — ABNORMAL HIGH (ref 0–149)
VLDL Cholesterol Cal: 34 mg/dL (ref 5–40)

## 2019-01-30 LAB — CMP14+EGFR
ALT: 23 IU/L (ref 0–32)
AST: 20 IU/L (ref 0–40)
Albumin/Globulin Ratio: 1.2 (ref 1.2–2.2)
Albumin: 3.7 g/dL — ABNORMAL LOW (ref 3.8–4.8)
Alkaline Phosphatase: 90 IU/L (ref 39–117)
BUN/Creatinine Ratio: 9 (ref 9–23)
BUN: 8 mg/dL (ref 6–24)
Bilirubin Total: <0.2 mg/dL (ref 0.0–1.2)
CO2: 24 mmol/L (ref 20–29)
Calcium: 8.9 mg/dL (ref 8.7–10.2)
Chloride: 101 mmol/L (ref 96–106)
Creatinine, Ser: 0.85 mg/dL (ref 0.57–1.00)
GFR calc Af Amer: 93 mL/min/{1.73_m2} (ref >59)
GFR calc non Af Amer: 81 mL/min/{1.73_m2} (ref >59)
Globulin, Total: 3.1 g/dL (ref 1.5–4.5)
Glucose: 94 mg/dL (ref 65–99)
Potassium: 4.2 mmol/L (ref 3.5–5.2)
Sodium: 139 mmol/L (ref 134–144)
Total Protein: 6.8 g/dL (ref 6.0–8.5)

## 2019-01-30 LAB — TSH: TSH: 8.7 u[IU]/mL — ABNORMAL HIGH (ref 0.450–4.500)

## 2019-01-31 ENCOUNTER — Other Ambulatory Visit: Payer: Self-pay | Admitting: Physician Assistant

## 2019-01-31 DIAGNOSIS — F321 Major depressive disorder, single episode, moderate: Secondary | ICD-10-CM

## 2019-01-31 DIAGNOSIS — K219 Gastro-esophageal reflux disease without esophagitis: Secondary | ICD-10-CM

## 2019-01-31 DIAGNOSIS — E039 Hypothyroidism, unspecified: Secondary | ICD-10-CM

## 2019-02-03 ENCOUNTER — Other Ambulatory Visit: Payer: Self-pay | Admitting: Physician Assistant

## 2019-02-03 ENCOUNTER — Telehealth: Payer: Self-pay | Admitting: Physician Assistant

## 2019-02-03 DIAGNOSIS — E039 Hypothyroidism, unspecified: Secondary | ICD-10-CM

## 2019-02-03 MED ORDER — LEVOTHYROXINE SODIUM 175 MCG PO TABS: ORAL_TABLET | ORAL | 1 refills | Status: DC

## 2019-02-03 NOTE — Telephone Encounter (Signed)
Patient aware of results and verbalizes understanding.  

## 2019-02-04 ENCOUNTER — Other Ambulatory Visit: Payer: Self-pay | Admitting: Physician Assistant

## 2019-02-04 MED ORDER — ATORVASTATIN CALCIUM 10 MG PO TABS: 10.0000 mg | ORAL_TABLET | Freq: Every day | ORAL | 1 refills | Status: DC

## 2019-02-05 ENCOUNTER — Telehealth: Payer: Self-pay | Admitting: *Deleted

## 2019-02-05 NOTE — Telephone Encounter (Signed)
When I restarted the medication a few months ago I told her to take 50 mcg a day for a month then go to 100 mcg for a month and then 150 mcg/month.  I assume that she had done this.  So has she not increased her dosing to 150 mcg already?  Please check and see what the patient has been taking.  Because this she is only been taking 50 mcg that I do not need to adjust the medication up to 175 mcg.

## 2019-02-05 NOTE — Telephone Encounter (Signed)
Patient states she will call back when she has her pills with her.

## 2019-02-05 NOTE — Telephone Encounter (Signed)
Fax from [facility]: Levothyroxine 175 mg sig Need script for 50 mcg or 100 mcg & quantity change

## 2019-02-12 ENCOUNTER — Telehealth: Payer: Self-pay | Admitting: Physician Assistant

## 2019-02-12 ENCOUNTER — Other Ambulatory Visit: Payer: Self-pay | Admitting: Physician Assistant

## 2019-02-12 DIAGNOSIS — E039 Hypothyroidism, unspecified: Secondary | ICD-10-CM

## 2019-02-12 MED ORDER — LEVOTHYROXINE SODIUM 75 MCG PO TABS: 75.0000 ug | ORAL_TABLET | Freq: Every day | ORAL | 0 refills | Status: DC

## 2019-02-12 NOTE — Telephone Encounter (Signed)
Patient states she has been on Levo 50 mcg and she never increased her does. Please refer to previous phone note and advise.

## 2019-02-12 NOTE — Telephone Encounter (Signed)
Prescription for 75 mcg levothyroxine is sent to the pharmacy.  Please recheck in 1 to 2 months.

## 2019-02-13 NOTE — Telephone Encounter (Signed)
Patient aware, she will come back in 1-2 months to have rechecked.

## 2019-03-01 ENCOUNTER — Other Ambulatory Visit: Payer: Self-pay | Admitting: Physician Assistant

## 2019-03-04 ENCOUNTER — Other Ambulatory Visit: Payer: Self-pay | Admitting: Physician Assistant

## 2019-03-04 DIAGNOSIS — E039 Hypothyroidism, unspecified: Secondary | ICD-10-CM

## 2019-03-04 DIAGNOSIS — F411 Generalized anxiety disorder: Secondary | ICD-10-CM

## 2019-03-04 MED ORDER — LEVOTHYROXINE SODIUM 75 MCG PO TABS: 75.0000 ug | ORAL_TABLET | Freq: Every day | ORAL | 0 refills | Status: DC

## 2019-03-12 ENCOUNTER — Ambulatory Visit: Payer: BLUE CROSS/BLUE SHIELD | Admitting: Physician Assistant

## 2019-03-21 ENCOUNTER — Other Ambulatory Visit: Payer: Self-pay

## 2019-03-24 ENCOUNTER — Ambulatory Visit: Payer: BC Managed Care – PPO | Admitting: Physician Assistant

## 2019-03-24 DIAGNOSIS — Z23 Encounter for immunization: Secondary | ICD-10-CM | POA: Diagnosis not present

## 2019-03-31 DIAGNOSIS — E663 Overweight: Secondary | ICD-10-CM | POA: Diagnosis not present

## 2019-03-31 DIAGNOSIS — Z719 Counseling, unspecified: Secondary | ICD-10-CM | POA: Diagnosis not present

## 2019-03-31 DIAGNOSIS — E782 Mixed hyperlipidemia: Secondary | ICD-10-CM | POA: Diagnosis not present

## 2019-04-28 ENCOUNTER — Other Ambulatory Visit: Payer: Self-pay | Admitting: Physician Assistant

## 2019-04-28 DIAGNOSIS — E039 Hypothyroidism, unspecified: Secondary | ICD-10-CM

## 2019-05-02 ENCOUNTER — Encounter: Payer: Self-pay | Admitting: Physician Assistant

## 2019-05-02 ENCOUNTER — Ambulatory Visit (INDEPENDENT_AMBULATORY_CARE_PROVIDER_SITE_OTHER): Payer: BC Managed Care – PPO | Admitting: Physician Assistant

## 2019-05-02 ENCOUNTER — Other Ambulatory Visit: Payer: Self-pay

## 2019-05-02 DIAGNOSIS — J4520 Mild intermittent asthma, uncomplicated: Secondary | ICD-10-CM | POA: Diagnosis not present

## 2019-05-02 DIAGNOSIS — E039 Hypothyroidism, unspecified: Secondary | ICD-10-CM

## 2019-05-02 DIAGNOSIS — R1010 Upper abdominal pain, unspecified: Secondary | ICD-10-CM

## 2019-05-02 DIAGNOSIS — R197 Diarrhea, unspecified: Secondary | ICD-10-CM

## 2019-05-02 DIAGNOSIS — K219 Gastro-esophageal reflux disease without esophagitis: Secondary | ICD-10-CM

## 2019-05-02 DIAGNOSIS — F411 Generalized anxiety disorder: Secondary | ICD-10-CM | POA: Diagnosis not present

## 2019-05-02 MED ORDER — ATORVASTATIN CALCIUM 10 MG PO TABS: 10.0000 mg | ORAL_TABLET | Freq: Every day | ORAL | 1 refills | Status: DC

## 2019-05-02 MED ORDER — LEVOTHYROXINE SODIUM 75 MCG PO TABS: 75.0000 ug | ORAL_TABLET | Freq: Every day | ORAL | 0 refills | Status: DC

## 2019-05-02 MED ORDER — ALPRAZOLAM 0.25 MG PO TABS: 0.2500 mg | ORAL_TABLET | Freq: Two times a day (BID) | ORAL | 1 refills | Status: DC | PRN

## 2019-05-02 MED ORDER — PANTOPRAZOLE SODIUM 40 MG PO TBEC: 40.0000 mg | DELAYED_RELEASE_TABLET | Freq: Every day | ORAL | 3 refills | Status: DC

## 2019-05-02 MED ORDER — BUDESONIDE-FORMOTEROL FUMARATE 80-4.5 MCG/ACT IN AERO: 2.0000 | INHALATION_SPRAY | Freq: Two times a day (BID) | RESPIRATORY_TRACT | 5 refills | Status: DC

## 2019-05-02 NOTE — Progress Notes (Signed)
Telephone visit  Subjective: CC: Recheck on chronic conditions, abdominal pain, GERD, diarrhea PCP: Remus Loffler, PA-C Rebecca Pham is a 48 y.o. female calls for telephone consult today. Patient provides verbal consent for consult held via phone.  Patient is identified with 2 separate identifiers.  At this time the entire area is on COVID-19 social distancing and stay home orders are in place.  Patient is of higher risk and therefore we are performing this by a virtual method.  Location of patient: home Location of provider: HOME Others present for call: no    This patient is having persistent upper abdominal pain she states that it hurts anywhere from the epigastric area to the umbilicus.  And in some to the left side.  She also for 3 weeks has had loose stools.  She normally has 1 bowel movement a day that is very well-formed and pretty predictable.  She states that she has never had issues with irritable bowel syndrome in the past.  She denies any fever or chills.  She had gotten off of her GERD medicine for a little bit but has restarted it.  She denies any weight loss.  She actually has had a hard time losing weight.  She has a lot of pain in the upper abdominal area and it does bother her at work.  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   She is also for her other chronic medical conditions.  She does have hypothyroidism, depression with anxiety.  She recently lost her mother to a stroke.  She was somewhat down about that.  All of her labs are reviewed and she will be coming in soon for the thyroid check to assure that it is finally in control. ROS: Per HPI  Allergies  Allergen Reactions  . Asa [Aspirin] Swelling   Past Medical History:  Diagnosis Date  . Asthma   . GERD (gastroesophageal reflux disease)     Current Outpatient Medications:  .  ALPRAZolam (XANAX) 0.25 MG tablet, Take 1 tablet (0.25 mg total) by mouth 2  (two) times daily as needed for anxiety., Disp: 30 tablet, Rfl: 1 .  atorvastatin (LIPITOR) 10 MG tablet, Take 1 tablet (10 mg total) by mouth daily., Disp: 90 tablet, Rfl: 1 .  budesonide-formoterol (SYMBICORT) 80-4.5 MCG/ACT inhaler, Inhale 2 puffs into the lungs 2 (two) times daily., Disp: 1 Inhaler, Rfl: 5 .  citalopram (CELEXA) 20 MG tablet, TAKE 0.5-1 TABLETS (10-20 MG TOTAL) BY MOUTH DAILY., Disp: 30 tablet, Rfl: 11 .  levothyroxine (SYNTHROID) 75 MCG tablet, Take 1 tablet (75 mcg total) by mouth daily before breakfast., Disp: 90 tablet, Rfl: 0 .  loratadine (CLARITIN) 10 MG tablet, Take 1 tablet (10 mg total) by mouth daily., Disp: 30 tablet, Rfl: 11 .  pantoprazole (PROTONIX) 40 MG tablet, Take 1 tablet (40 mg total) by mouth daily., Disp: 90 tablet, Rfl: 3  Assessment/ Plan: 48 y.o. female   1. Gastroesophageal reflux disease without esophagitis - Ambulatory referral to Gastroenterology  2. Mild intermittent asthma without complication - budesonide-formoterol (SYMBICORT) 80-4.5 MCG/ACT inhaler; Inhale 2 puffs into the lungs 2 (two) times daily.  Dispense: 1 Inhaler; Refill: 5  3. Acquired hypothyroidism - levothyroxine (SYNTHROID) 75 MCG tablet; Take 1 tablet (75 mcg total) by mouth daily before breakfast.  Dispense: 90 tablet; Refill: 0  4. GAD (generalized anxiety disorder) - ALPRAZolam (XANAX) 0.25 MG tablet; Take 1 tablet (0.25  mg total) by mouth 2 (two) times daily as needed for anxiety.  Dispense: 30 tablet; Refill: 1  5. Pain of upper abdomen - Ambulatory referral to Gastroenterology  6. Diarrhea, unspecified type - Ambulatory referral to Gastroenterology   Return in about 6 months (around 10/30/2019).  Continue all other maintenance medications as listed above.  Start time: 11:43 AM End time: 12:03 PM  Meds ordered this encounter  Medications  . atorvastatin (LIPITOR) 10 MG tablet    Sig: Take 1 tablet (10 mg total) by mouth daily.    Dispense:  90 tablet     Refill:  1    Order Specific Question:   Supervising Provider    Answer:   Janora Norlander [[id number]]  . budesonide-formoterol (SYMBICORT) 80-4.5 MCG/ACT inhaler    Sig: Inhale 2 puffs into the lungs 2 (two) times daily.    Dispense:  1 Inhaler    Refill:  5    Order Specific Question:   Supervising Provider    Answer:   Janora Norlander [[id number]]  . pantoprazole (PROTONIX) 40 MG tablet    Sig: Take 1 tablet (40 mg total) by mouth daily.    Dispense:  90 tablet    Refill:  3    Order Specific Question:   Supervising Provider    Answer:   Janora Norlander [[id number]]  . levothyroxine (SYNTHROID) 75 MCG tablet    Sig: Take 1 tablet (75 mcg total) by mouth daily before breakfast.    Dispense:  90 tablet    Refill:  0    Order Specific Question:   Supervising Provider    Answer:   Janora Norlander [[id number]]  . ALPRAZolam (XANAX) 0.25 MG tablet    Sig: Take 1 tablet (0.25 mg total) by mouth 2 (two) times daily as needed for anxiety.    Dispense:  30 tablet    Refill:  1    Generalized anxiety    Order Specific Question:   Supervising Provider    Answer:   Janora Norlander [[id number]]    Particia Nearing PA-C [place] [phone]

## 2019-05-06 ENCOUNTER — Encounter: Payer: Self-pay | Admitting: Internal Medicine

## 2019-05-22 ENCOUNTER — Ambulatory Visit: Payer: Self-pay | Admitting: Gastroenterology

## 2019-08-05 ENCOUNTER — Other Ambulatory Visit: Payer: BC Managed Care – PPO

## 2019-08-05 ENCOUNTER — Other Ambulatory Visit: Payer: Self-pay

## 2019-08-05 DIAGNOSIS — E039 Hypothyroidism, unspecified: Secondary | ICD-10-CM

## 2019-08-06 LAB — THYROID PANEL WITH TSH
Free Thyroxine Index: 2.1 (ref 1.2–4.9)
T3 Uptake Ratio: 29 % (ref 24–39)
T4, Total: 7.3 ug/dL (ref 4.5–12.0)
TSH: 2.56 u[IU]/mL (ref 0.450–4.500)

## 2019-08-18 ENCOUNTER — Ambulatory Visit: Payer: BC Managed Care – PPO | Admitting: Physician Assistant

## 2019-08-26 ENCOUNTER — Encounter: Payer: Self-pay | Admitting: Physician Assistant

## 2019-08-26 ENCOUNTER — Ambulatory Visit (INDEPENDENT_AMBULATORY_CARE_PROVIDER_SITE_OTHER): Payer: BC Managed Care – PPO | Admitting: Physician Assistant

## 2019-08-26 NOTE — Progress Notes (Signed)
8:34 am no answer  8:41 AM no answer  9:42 AM no answer   patient was called 3 times and no answer  Remus Loffler PA-C [facility] [address]  [place], [place] [address] [phone]

## 2019-09-02 ENCOUNTER — Other Ambulatory Visit: Payer: Self-pay | Admitting: *Deleted

## 2019-09-02 DIAGNOSIS — E039 Hypothyroidism, unspecified: Secondary | ICD-10-CM

## 2019-09-02 MED ORDER — LEVOTHYROXINE SODIUM 75 MCG PO TABS: 75.0000 ug | ORAL_TABLET | Freq: Every day | ORAL | 0 refills | Status: DC

## 2019-09-02 NOTE — Telephone Encounter (Signed)
Refill given for levothyroxine appt scheduled.

## 2019-09-03 ENCOUNTER — Ambulatory Visit: Payer: BC Managed Care – PPO | Admitting: Physician Assistant

## 2019-09-23 ENCOUNTER — Other Ambulatory Visit: Payer: Self-pay | Admitting: *Deleted

## 2019-09-23 DIAGNOSIS — L239 Allergic contact dermatitis, unspecified cause: Secondary | ICD-10-CM

## 2019-09-23 MED ORDER — LORATADINE 10 MG PO TABS: 10.0000 mg | ORAL_TABLET | Freq: Every day | ORAL | 1 refills | Status: DC

## 2019-09-30 ENCOUNTER — Ambulatory Visit: Payer: BC Managed Care – PPO | Admitting: Physician Assistant

## 2019-09-30 ENCOUNTER — Ambulatory Visit: Payer: BC Managed Care – PPO | Admitting: Family Medicine

## 2019-10-02 ENCOUNTER — Encounter: Payer: Self-pay | Admitting: Physician Assistant

## 2019-10-09 DIAGNOSIS — M7731 Calcaneal spur, right foot: Secondary | ICD-10-CM | POA: Diagnosis not present

## 2019-10-09 DIAGNOSIS — M79671 Pain in right foot: Secondary | ICD-10-CM | POA: Diagnosis not present

## 2019-12-12 ENCOUNTER — Other Ambulatory Visit: Payer: Self-pay

## 2019-12-12 DIAGNOSIS — E039 Hypothyroidism, unspecified: Secondary | ICD-10-CM

## 2019-12-12 MED ORDER — ATORVASTATIN CALCIUM 10 MG PO TABS: 10.0000 mg | ORAL_TABLET | Freq: Every day | ORAL | 0 refills | Status: DC

## 2019-12-12 MED ORDER — LEVOTHYROXINE SODIUM 75 MCG PO TABS: 75.0000 ug | ORAL_TABLET | Freq: Every day | ORAL | 0 refills | Status: DC

## 2019-12-22 ENCOUNTER — Other Ambulatory Visit: Payer: Self-pay | Admitting: Family Medicine

## 2019-12-23 NOTE — Telephone Encounter (Signed)
Spoke with pt and advised she ntbs for any refills with a provider in our office. Offered to help pt schedule a follow up and pt states she will call back later to schedule follow up.

## 2019-12-23 NOTE — Telephone Encounter (Signed)
Former [facility] NTBS 30 days given 12/12/19

## 2019-12-25 ENCOUNTER — Other Ambulatory Visit: Payer: Self-pay | Admitting: *Deleted

## 2019-12-25 DIAGNOSIS — E039 Hypothyroidism, unspecified: Secondary | ICD-10-CM

## 2019-12-25 MED ORDER — LEVOTHYROXINE SODIUM 75 MCG PO TABS: 75.0000 ug | ORAL_TABLET | Freq: Every day | ORAL | 0 refills | Status: DC

## 2020-01-29 ENCOUNTER — Ambulatory Visit: Payer: BC Managed Care – PPO | Admitting: Family

## 2020-02-20 ENCOUNTER — Other Ambulatory Visit: Payer: Self-pay | Admitting: Family Medicine

## 2020-02-20 DIAGNOSIS — F321 Major depressive disorder, single episode, moderate: Secondary | ICD-10-CM

## 2020-03-05 ENCOUNTER — Other Ambulatory Visit: Payer: Self-pay | Admitting: Family Medicine

## 2020-03-11 ENCOUNTER — Ambulatory Visit: Payer: BC Managed Care – PPO | Admitting: Nurse Practitioner

## 2020-03-17 ENCOUNTER — Other Ambulatory Visit: Payer: Self-pay | Admitting: Nurse Practitioner

## 2020-03-17 ENCOUNTER — Encounter: Payer: Self-pay | Admitting: Physician Assistant

## 2020-03-17 DIAGNOSIS — F321 Major depressive disorder, single episode, moderate: Secondary | ICD-10-CM

## 2020-03-17 NOTE — Telephone Encounter (Signed)
Left detailed message.   

## 2020-03-17 NOTE — Telephone Encounter (Signed)
Former [facility] NTBS 30 days given 02/20/20

## 2020-03-22 DIAGNOSIS — Z23 Encounter for immunization: Secondary | ICD-10-CM | POA: Diagnosis not present

## 2020-04-01 ENCOUNTER — Other Ambulatory Visit: Payer: Self-pay | Admitting: Nurse Practitioner

## 2020-04-08 ENCOUNTER — Telehealth: Payer: Self-pay | Admitting: Physician Assistant

## 2020-04-08 NOTE — Telephone Encounter (Signed)
Patient keeps no showing and canceling appts are you willing to send in or can she not get anymore until she shows. Please look at patients last appointments.

## 2020-04-08 NOTE — Telephone Encounter (Signed)
Pt states that she is out of all of her medication and was persistent about having an extension on her refills. She had an appt on Monday (11/01) but canceled because she said that she would not be able to make it, I have put her in for the first available which is 11/11

## 2020-04-09 ENCOUNTER — Other Ambulatory Visit (INDEPENDENT_AMBULATORY_CARE_PROVIDER_SITE_OTHER): Payer: BC Managed Care – PPO | Admitting: Nurse Practitioner

## 2020-04-09 DIAGNOSIS — L239 Allergic contact dermatitis, unspecified cause: Secondary | ICD-10-CM

## 2020-04-09 DIAGNOSIS — F321 Major depressive disorder, single episode, moderate: Secondary | ICD-10-CM

## 2020-04-09 DIAGNOSIS — E039 Hypothyroidism, unspecified: Secondary | ICD-10-CM

## 2020-04-09 MED ORDER — CITALOPRAM HYDROBROMIDE 20 MG PO TABS: 10.0000 mg | ORAL_TABLET | Freq: Every day | ORAL | 0 refills | Status: DC

## 2020-04-09 MED ORDER — PANTOPRAZOLE SODIUM 40 MG PO TBEC: 40.0000 mg | DELAYED_RELEASE_TABLET | Freq: Every day | ORAL | 0 refills | Status: DC

## 2020-04-09 MED ORDER — LORATADINE 10 MG PO TABS: 10.0000 mg | ORAL_TABLET | Freq: Every day | ORAL | 0 refills | Status: DC

## 2020-04-09 MED ORDER — LEVOTHYROXINE SODIUM 75 MCG PO TABS: 75.0000 ug | ORAL_TABLET | Freq: Every day | ORAL | 0 refills | Status: DC

## 2020-04-09 MED ORDER — ATORVASTATIN CALCIUM 10 MG PO TABS: 10.0000 mg | ORAL_TABLET | Freq: Every day | ORAL | 0 refills | Status: DC

## 2020-04-09 NOTE — Telephone Encounter (Signed)
Attempted to contact patient- mailbox full  

## 2020-04-09 NOTE — Telephone Encounter (Signed)
I will see patient on next appointment. Refill for 2 weeks sent to pharmacy

## 2020-04-12 ENCOUNTER — Ambulatory Visit: Payer: BC Managed Care – PPO | Admitting: Nurse Practitioner

## 2020-04-22 ENCOUNTER — Encounter: Payer: Self-pay | Admitting: Nurse Practitioner

## 2020-04-22 ENCOUNTER — Other Ambulatory Visit: Payer: Self-pay

## 2020-04-22 ENCOUNTER — Ambulatory Visit (INDEPENDENT_AMBULATORY_CARE_PROVIDER_SITE_OTHER): Payer: BC Managed Care – PPO | Admitting: Nurse Practitioner

## 2020-04-22 VITALS — BP 118/72 | HR 88 | Temp 97.2°F | Ht 64.0 in | Wt 203.6 lb

## 2020-04-22 DIAGNOSIS — K219 Gastro-esophageal reflux disease without esophagitis: Secondary | ICD-10-CM

## 2020-04-22 DIAGNOSIS — Z79891 Long term (current) use of opiate analgesic: Secondary | ICD-10-CM | POA: Diagnosis not present

## 2020-04-22 DIAGNOSIS — E039 Hypothyroidism, unspecified: Secondary | ICD-10-CM

## 2020-04-22 DIAGNOSIS — L239 Allergic contact dermatitis, unspecified cause: Secondary | ICD-10-CM

## 2020-04-22 DIAGNOSIS — Z79899 Other long term (current) drug therapy: Secondary | ICD-10-CM

## 2020-04-22 DIAGNOSIS — F321 Major depressive disorder, single episode, moderate: Secondary | ICD-10-CM

## 2020-04-22 DIAGNOSIS — F411 Generalized anxiety disorder: Secondary | ICD-10-CM

## 2020-04-22 MED ORDER — LEVOTHYROXINE SODIUM 75 MCG PO TABS: 75.0000 ug | ORAL_TABLET | Freq: Every day | ORAL | 0 refills | Status: DC

## 2020-04-22 MED ORDER — ALPRAZOLAM 0.25 MG PO TABS: 0.2500 mg | ORAL_TABLET | Freq: Two times a day (BID) | ORAL | 1 refills | Status: DC | PRN

## 2020-04-22 MED ORDER — CITALOPRAM HYDROBROMIDE 20 MG PO TABS: 10.0000 mg | ORAL_TABLET | Freq: Every day | ORAL | 0 refills | Status: DC

## 2020-04-22 MED ORDER — PANTOPRAZOLE SODIUM 40 MG PO TBEC: 40.0000 mg | DELAYED_RELEASE_TABLET | Freq: Every day | ORAL | 0 refills | Status: DC

## 2020-04-22 MED ORDER — ATORVASTATIN CALCIUM 10 MG PO TABS: 10.0000 mg | ORAL_TABLET | Freq: Every day | ORAL | 0 refills | Status: DC

## 2020-04-22 MED ORDER — LORATADINE 10 MG PO TABS: 10.0000 mg | ORAL_TABLET | Freq: Every day | ORAL | 0 refills | Status: DC

## 2020-04-22 NOTE — Assessment & Plan Note (Signed)
Patient is hypothyroid is well managed on current medication no changes to medication dose.  Provided education to patient with printed handouts given.  Follow-up in 3 weeks for complete physical and labs.

## 2020-04-22 NOTE — Assessment & Plan Note (Signed)
Patient's generalized anxiety is well controlled on current medication Xanax 0.25 mg 1 tablet by mouth twice daily as needed.  No changes to medication dose.  Continue to provide education to patient on stress management.  Advised patient to speak with grief counseling patient is not ready at the moment.  We will continue to educate patient. Printed handouts given.  Completed drug screen and agreement sheet. Rx refill sent to pharmacy.  Follow-up in 3 to 4 weeks.

## 2020-04-22 NOTE — Assessment & Plan Note (Signed)
GERD well controlled on current medication Protonix 40 mg tablet by mouth daily.  No changes to medication dose.    Continue healthy diet with exercise regimen. Provided education to patient with printed handouts given. Follow-up with worsening or unresolved symptoms. Rx refill sent to pharmacy

## 2020-04-22 NOTE — Patient Instructions (Addendum)
Hypothyroidism  Hypothyroidism is when the thyroid gland does not make enough of certain hormones (it is underactive). The thyroid gland is a small gland located in the lower front part of the neck, just in front of the windpipe (trachea). This gland makes hormones that help control how the body uses food for energy (metabolism) as well as how the heart and brain function. These hormones also play a role in keeping your bones strong. When the thyroid is underactive, it produces too little of the hormones thyroxine (T4) and triiodothyronine (T3). What are the causes? This condition may be caused by:  Hashimoto's disease. This is a disease in which the body's disease-fighting system (immune system) attacks the thyroid gland. This is the most common cause.  Viral infections.  Pregnancy.  Certain medicines.  Birth defects.  Past radiation treatments to the head or neck for cancer.  Past treatment with radioactive iodine.  Past exposure to radiation in the environment.  Past surgical removal of part or all of the thyroid.  Problems with a gland in the center of the brain (pituitary gland).  Lack of enough iodine in the diet. What increases the risk? You are more likely to develop this condition if:  You are female.  You have a family history of thyroid conditions.  You use a medicine called lithium.  You take medicines that affect the immune system (immunosuppressants). What are the signs or symptoms? Symptoms of this condition include:  Feeling as though you have no energy (lethargy).  Not being able to tolerate cold.  Weight gain that is not explained by a change in diet or exercise habits.  Lack of appetite.  Dry skin.  Coarse hair.  Menstrual irregularity.  Slowing of thought processes.  Constipation.  Sadness or depression. How is this diagnosed? This condition may be diagnosed based on:  Your symptoms, your medical history, and a physical exam.  Blood  tests. You may also have imaging tests, such as an ultrasound or MRI. How is this treated? This condition is treated with medicine that replaces the thyroid hormones that your body does not make. After you begin treatment, it may take several weeks for symptoms to go away. Follow these instructions at home:  Take over-the-counter and prescription medicines only as told by your health care provider.  If you start taking any new medicines, tell your health care provider.  Keep all follow-up visits as told by your health care provider. This is important. ? As your condition improves, your dosage of thyroid hormone medicine may change. ? You will need to have blood tests regularly so that your health care provider can monitor your condition. Contact a health care provider if:  Your symptoms do not get better with treatment.  You are taking thyroid replacement medicine and you: ? Sweat a lot. ? Have tremors. ? Feel anxious. ? Lose weight rapidly. ? Cannot tolerate heat. ? Have emotional swings. ? Have diarrhea. ? Feel weak. Get help right away if you have:  Chest pain.  An irregular heartbeat.  A rapid heartbeat.  Difficulty breathing. Summary  Hypothyroidism is when the thyroid gland does not make enough of certain hormones (it is underactive).  When the thyroid is underactive, it produces too little of the hormones thyroxine (T4) and triiodothyronine (T3).  The most common cause is Hashimoto's disease, a disease in which the body's disease-fighting system (immune system) attacks the thyroid gland. The condition can also be caused by viral infections, medicine, pregnancy, or past   radiation treatment to the head or neck.  Symptoms may include weight gain, dry skin, constipation, feeling as though you do not have energy, and not being able to tolerate cold.  This condition is treated with medicine to replace the thyroid hormones that your body does not make. This information  is not intended to replace advice given to you by your health care provider. Make sure you discuss any questions you have with your health care provider. Document Revised: 05/11/2017 Document Reviewed: 05/09/2017 Elsevier Patient Education  2020 [employer] Gastroesophageal Reflux Disease, Adult Gastroesophageal reflux (GER) happens when acid from the stomach flows up into the tube that connects the mouth and the stomach (esophagus). Normally, food travels down the esophagus and stays in the stomach to be digested. With GER, food and stomach acid sometimes move back up into the esophagus. You may have a disease called gastroesophageal reflux disease (GERD) if the reflux:  Happens often.  Causes frequent or very bad symptoms.  Causes problems such as damage to the esophagus. When this happens, the esophagus becomes sore and swollen (inflamed). Over time, GERD can make small holes (ulcers) in the lining of the esophagus. What are the causes? This condition is caused by a problem with the muscle between the esophagus and the stomach. When this muscle is weak or not normal, it does not close properly to keep food and acid from coming back up from the stomach. The muscle can be weak because of:  Tobacco use.  Pregnancy.  Having a certain type of hernia (hiatal hernia).  Alcohol use.  Certain foods and drinks, such as coffee, chocolate, onions, and peppermint. What increases the risk? You are more likely to develop this condition if you:  Are overweight.  Have a disease that affects your connective tissue.  Use NSAID medicines. What are the signs or symptoms? Symptoms of this condition include:  Heartburn.  Difficult or painful swallowing.  The feeling of having a lump in the throat.  A bitter taste in the mouth.  Bad breath.  Having a lot of saliva.  Having an upset or bloated stomach.  Belching.  Chest pain. Different conditions can cause chest pain. Make sure you  see your doctor if you have chest pain.  Shortness of breath or noisy breathing (wheezing).  Ongoing (chronic) cough or a cough at night.  Wearing away of the surface of teeth (tooth enamel).  Weight loss. How is this treated? Treatment will depend on how bad your symptoms are. Your doctor may suggest:  Changes to your diet.  Medicine.  Surgery. Follow these instructions at home: Eating and drinking   Follow a diet as told by your doctor. You may need to avoid foods and drinks such as: ? Coffee and tea (with or without caffeine). ? Drinks that contain alcohol. ? Energy drinks and sports drinks. ? Bubbly (carbonated) drinks or sodas. ? Chocolate and cocoa. ? Peppermint and mint flavorings. ? Garlic and onions. ? Horseradish. ? Spicy and acidic foods. These include peppers, chili powder, curry powder, vinegar, hot sauces, and BBQ sauce. ? Citrus fruit juices and citrus fruits, such as oranges, lemons, and limes. ? Tomato-based foods. These include red sauce, chili, salsa, and pizza with red sauce. ? Fried and fatty foods. These include donuts, french fries, potato chips, and high-fat dressings. ? High-fat meats. These include hot dogs, rib eye steak, sausage, ham, and bacon. ? High-fat dairy items, such as whole milk, butter, and cream cheese.  Eat small meals  often. Avoid eating large meals.  Avoid drinking large amounts of liquid with your meals.  Avoid eating meals during the 2-3 hours before bedtime.  Avoid lying down right after you eat.  Do not exercise right after you eat. Lifestyle   Do not use any products that contain nicotine or tobacco. These include cigarettes, e-cigarettes, and chewing tobacco. If you need help quitting, ask your doctor.  Try to lower your stress. If you need help doing this, ask your doctor.  If you are overweight, lose an amount of weight that is healthy for you. Ask your doctor about a safe weight loss goal. General  instructions  Pay attention to any changes in your symptoms.  Take over-the-counter and prescription medicines only as told by your doctor. Do not take aspirin, ibuprofen, or other NSAIDs unless your doctor says it is okay.  Wear loose clothes. Do not wear anything tight around your waist.  Raise (elevate) the head of your bed about 6 inches (15 cm).  Avoid bending over if this makes your symptoms worse.  Keep all follow-up visits as told by your doctor. This is important. Contact a doctor if:  You have new symptoms.  You lose weight and you do not know why.  You have trouble swallowing or it hurts to swallow.  You have wheezing or a cough that keeps happening.  Your symptoms do not get better with treatment.  You have a hoarse voice. Get help right away if:  You have pain in your arms, neck, jaw, teeth, or back.  You feel sweaty, dizzy, or light-headed.  You have chest pain or shortness of breath.  You throw up (vomit) and your throw-up looks like blood or coffee grounds.  You pass out (faint).  Your poop (stool) is bloody or black.  You cannot swallow, drink, or eat. Summary  If a person has gastroesophageal reflux disease (GERD), food and stomach acid move back up into the esophagus and cause symptoms or problems such as damage to the esophagus.  Treatment will depend on how bad your symptoms are.  Follow a diet as told by your doctor.  Take all medicines only as told by your doctor. This information is not intended to replace advice given to you by your health care provider. Make sure you discuss any questions you have with your health care provider. Document Revised: 12/05/2017 Document Reviewed: 12/05/2017 Elsevier Patient Education  2020 [employer] Inc. Generalized Anxiety Disorder, Adult Generalized anxiety disorder (GAD) is a mental health disorder. People with this condition constantly worry about everyday events. Unlike normal anxiety, worry related to  GAD is not triggered by a specific event. These worries also do not fade or get better with time. GAD interferes with life functions, including relationships, work, and school. GAD can vary from mild to severe. People with severe GAD can have intense waves of anxiety with physical symptoms (panic attacks). What are the causes? The exact cause of GAD is not known. What increases the risk? This condition is more likely to develop in:  Women.  People who have a family history of anxiety disorders.  People who are very shy.  People who experience very stressful life events, such as the death of a loved one.  People who have a very stressful family environment. What are the signs or symptoms? People with GAD often worry excessively about many things in their lives, such as their health and family. They may also be overly concerned about:  Doing well at  work.  Being on time.  Natural disasters.  Friendships. Physical symptoms of GAD include:  Fatigue.  Muscle tension or having muscle twitches.  Trembling or feeling shaky.  Being easily startled.  Feeling like your heart is pounding or racing.  Feeling out of breath or like you cannot take a deep breath.  Having trouble falling asleep or staying asleep.  Sweating.  Nausea, diarrhea, or irritable bowel syndrome (IBS).  Headaches.  Trouble concentrating or remembering facts.  Restlessness.  Irritability. How is this diagnosed? Your health care provider can diagnose GAD based on your symptoms and medical history. You will also have a physical exam. The health care provider will ask specific questions about your symptoms, including how severe they are, when they started, and if they come and go. Your health care provider may ask you about your use of alcohol or drugs, including prescription medicines. Your health care provider may refer you to a mental health specialist for further evaluation. Your health care provider  will do a thorough examination and may perform additional tests to rule out other possible causes of your symptoms. To be diagnosed with GAD, a person must have anxiety that:  Is out of his or her control.  Affects several different aspects of his or her life, such as work and relationships.  Causes distress that makes him or her unable to take part in normal activities.  Includes at least three physical symptoms of GAD, such as restlessness, fatigue, trouble concentrating, irritability, muscle tension, or sleep problems. Before your health care provider can confirm a diagnosis of GAD, these symptoms must be present more days than they are not, and they must last for six months or longer. How is this treated? The following therapies are usually used to treat GAD:  Medicine. Antidepressant medicine is usually prescribed for long-term daily control. Antianxiety medicines may be added in severe cases, especially when panic attacks occur.  Talk therapy (psychotherapy). Certain types of talk therapy can be helpful in treating GAD by providing support, education, and guidance. Options include: ? Cognitive behavioral therapy (CBT). People learn coping skills and techniques to ease their anxiety. They learn to identify unrealistic or negative thoughts and behaviors and to replace them with positive ones. ? Acceptance and commitment therapy (ACT). This treatment teaches people how to be mindful as a way to cope with unwanted thoughts and feelings. ? Biofeedback. This process trains you to manage your body's response (physiological response) through breathing techniques and relaxation methods. You will work with a therapist while machines are used to monitor your physical symptoms.  Stress management techniques. These include yoga, meditation, and exercise. A mental health specialist can help determine which treatment is best for you. Some people see improvement with one type of therapy. However, other  people require a combination of therapies. Follow these instructions at home:  Take over-the-counter and prescription medicines only as told by your health care provider.  Try to maintain a normal routine.  Try to anticipate stressful situations and allow extra time to manage them.  Practice any stress management or self-calming techniques as taught by your health care provider.  Do not punish yourself for setbacks or for not making progress.  Try to recognize your accomplishments, even if they are small.  Keep all follow-up visits as told by your health care provider. This is important. Contact a health care provider if:  Your symptoms do not get better.  Your symptoms get worse.  You have signs of depression, such  as: ? A persistently sad, cranky, or irritable mood. ? Loss of enjoyment in activities that used to bring you joy. ? Change in weight or eating. ? Changes in sleeping habits. ? Avoiding friends or family members. ? Loss of energy for normal tasks. ? Feelings of guilt or worthlessness. Get help right away if:  You have serious thoughts about hurting yourself or others. If you ever feel like you may hurt yourself or others, or have thoughts about taking your own life, get help right away. You can go to your nearest emergency department or call:  Your local emergency services (911 in the [place]  A suicide crisis helpline, such as the National Suicide Prevention Lifeline at 8160905333. This is open 24 hours a day. Summary  Generalized anxiety disorder (GAD) is a mental health disorder that involves worry that is not triggered by a specific event.  People with GAD often worry excessively about many things in their lives, such as their health and family.  GAD may cause physical symptoms such as restlessness, trouble concentrating, sleep problems, frequent sweating, nausea, diarrhea, headaches, and trembling or muscle twitching.  A mental health specialist can  help determine which treatment is best for you. Some people see improvement with one type of therapy. However, other people require a combination of therapies. This information is not intended to replace advice given to you by your health care provider. Make sure you discuss any questions you have with your health care provider. Document Revised: 05/11/2017 Document Reviewed: 04/18/2016 Elsevier Patient Education  2020 [employer].

## 2020-04-22 NOTE — Progress Notes (Signed)
New Patient Note  RE: Rebecca Pham MRN: [id number] DOB: 12/27/1970 Date of Office Visit: 04/22/2020  Chief Complaint: Medical Management of Chronic Issues, Establish Care, and Hypothyroidism  History of Present Illness:  Thyroid Problem Presents for follow-up (Hypothyroid) visit. Symptoms include anxiety and fatigue. The symptoms have been improving.     GERD, Follow up:  The patient was last seen for GERD 3  years ago. Changes made since that visit include none.  She reports excellent compliance with treatment. She is not having side effects.   . She is NOT experiencing abdominal bloating, belching, belching and eructation, bilious reflux or chest pain  Anxiety: Patient complains of anxiety disorder.  She has the following symptoms: fatigue, feelings of losing control, irritable. Onset of symptoms was approximately 3 years ago, unchanged since that time. She denies current suicidal and homicidal ideation. Family history significant for no psychiatric illness.Possible organic causes contributing are: none. Risk factors: previous episode of depression Previous treatment includes Xanax .  She complains of the following side effects from the treatment: none.   Assessment and Plan: Labria is a 49 y.o. female with: Hypothyroid Patient is hypothyroid is well managed on current medication no changes to medication dose.  Provided education to patient with printed handouts given.  Follow-up in 3 weeks for complete physical and labs.  Gastroesophageal reflux disease without esophagitis GERD well controlled on current medication Protonix 40 mg tablet by mouth daily.  No changes to medication dose.    Continue healthy diet with exercise regimen. Provided education to patient with printed handouts given. Follow-up with worsening or unresolved symptoms. Rx refill sent to pharmacy  GAD (generalized anxiety disorder) Patient's generalized anxiety is well controlled on current  medication Xanax 0.25 mg 1 tablet by mouth twice daily as needed.  No changes to medication dose.  Continue to provide education to patient on stress management.  Advised patient to speak with grief counseling patient is not ready at the moment.  We will continue to educate patient. Printed handouts given.  Completed drug screen and agreement sheet. Rx refill sent to pharmacy.  Follow-up in 3 to 4 weeks.  Return in about 3 months (around 07/23/2020).   Diagnostics:   Past Medical History: Patient Active Problem List   Diagnosis Date Noted  . Chronic bilateral low back pain with bilateral sciatica 09/06/2018  . Lateral epicondylitis, left elbow 08/23/2018  . Hypothyroid 01/02/2018  . Stress incontinence 01/01/2018  . Abnormal mammogram 01/01/2018  . ASCUS with positive high risk HPV cervical 03/05/2017  . Gastroesophageal reflux disease without esophagitis 01/31/2017  . Mild intermittent asthma without complication 01/31/2017  . Depression, major, single episode, moderate (HCC) 01/31/2017  . GAD (generalized anxiety disorder) 01/31/2017  . Allergic dermatitis 01/31/2017  . Hives 01/31/2017   Past Medical History:  Diagnosis Date  . Asthma   . GERD (gastroesophageal reflux disease)    Past Surgical History: Past Surgical History:  Procedure Laterality Date  . KNEE SURGERY  1997  . TUBAL LIGATION     Medication List:  Current Outpatient Medications  Medication Sig Dispense Refill  . ALPRAZolam (XANAX) 0.25 MG tablet Take 1 tablet (0.25 mg total) by mouth 2 (two) times daily as needed for anxiety. 30 tablet 1  . atorvastatin (LIPITOR) 10 MG tablet Take 1 tablet (10 mg total) by mouth daily. 14 tablet 0  . budesonide-formoterol (SYMBICORT) 80-4.5 MCG/ACT inhaler Inhale 2 puffs into the lungs 2 (two) times daily. 1 Inhaler 5  . citalopram (  CELEXA) 20 MG tablet Take 0.5-1 tablets (10-20 mg total) by mouth daily. (Needs to be seen before next refill) 14 tablet 0  . levothyroxine  (SYNTHROID) 75 MCG tablet Take 1 tablet (75 mcg total) by mouth daily before breakfast. Needs to be seen before next refill 14 tablet 0  . loratadine (CLARITIN) 10 MG tablet Take 1 tablet (10 mg total) by mouth daily. 14 tablet 0  . pantoprazole (PROTONIX) 40 MG tablet Take 1 tablet (40 mg total) by mouth daily. 14 tablet 0   No current facility-administered medications for this visit.   Allergies: Allergies  Allergen Reactions  . Asa [Aspirin] Swelling   Social History: Social History   Socioeconomic History  . Marital status: Married    Spouse name: Not on file  . Number of children: Not on file  . Years of education: Not on file  . Highest education level: Not on file  Occupational History  . Not on file  Tobacco Use  . Smoking status: Never Smoker  . Smokeless tobacco: Never Used  Vaping Use  . Vaping Use: Never used  Substance and Sexual Activity  . Alcohol use: Yes    Comment: occ  . Drug use: No  . Sexual activity: Yes    Birth control/protection: Surgical  Other Topics Concern  . Not on file  Social History Narrative  . Not on file   Social Determinants of Health   Financial Resource Strain:   . Difficulty of Paying Living Expenses: Not on file  Food Insecurity:   . Worried About Programme researcher, broadcasting/film/video in the Last Year: Not on file  . Ran Out of Food in the Last Year: Not on file  Transportation Needs:   . Lack of Transportation (Medical): Not on file  . Lack of Transportation (Non-Medical): Not on file  Physical Activity:   . Days of Exercise per Week: Not on file  . Minutes of Exercise per Session: Not on file  Stress:   . Feeling of Stress : Not on file  Social Connections:   . Frequency of Communication with Friends and Family: Not on file  . Frequency of Social Gatherings with Friends and Family: Not on file  . Attends Religious Services: Not on file  . Active Member of Clubs or Organizations: Not on file  . Attends Banker Meetings:  Not on file  . Marital Status: Not on file       Family History: Family History  Problem Relation Age of Onset  . COPD Father   . Arthritis Father   . Cancer Maternal Grandmother        lung  . Vision loss Paternal Grandfather   . Diabetes Paternal Grandfather   . Heart disease Paternal Grandfather          Review of Systems  Constitutional: Positive for fatigue.  Psychiatric/Behavioral: Negative for agitation, behavioral problems, self-injury and suicidal ideas. The patient is nervous/anxious.   All other systems reviewed and are negative.  Objective: BP 118/72   Pulse 88   Temp (!) 97.2 F (36.2 C) (Temporal)   Ht 5\' 4"  (1.626 m)   Wt 203 lb 9.6 oz (92.4 kg)   SpO2 96%   BMI 34.95 kg/m  Body mass index is 34.95 kg/m. Physical Exam Vitals reviewed.  Constitutional:      Appearance: Normal appearance.  HENT:     Head: Normocephalic.  Eyes:     Conjunctiva/sclera: Conjunctivae normal.  Cardiovascular:  Rate and Rhythm: Normal rate.     Pulses: Normal pulses.     Heart sounds: Normal heart sounds.  Pulmonary:     Breath sounds: Normal breath sounds.  Abdominal:     General: Bowel sounds are normal.  Musculoskeletal:        General: No tenderness.  Skin:    General: Skin is warm.  Neurological:     Mental Status: She is alert and oriented to person, place, and time.  Psychiatric:     Comments: Positive for anxiety    The plan was reviewed with the patient/family, and all questions/concerned were addressed.  It was my pleasure to see Rebecca Pham today and participate in her care. Please feel free to contact me with any questions or concerns.  Sincerely,  Lynnell Chad NP [facility]

## 2020-04-28 LAB — TOXASSURE SELECT 13 (MW), URINE

## 2020-05-10 ENCOUNTER — Other Ambulatory Visit: Payer: Self-pay | Admitting: Family Medicine

## 2020-05-10 ENCOUNTER — Other Ambulatory Visit: Payer: Self-pay | Admitting: Nurse Practitioner

## 2020-05-10 DIAGNOSIS — F321 Major depressive disorder, single episode, moderate: Secondary | ICD-10-CM

## 2020-05-14 ENCOUNTER — Ambulatory Visit: Payer: BC Managed Care – PPO | Admitting: Nurse Practitioner

## 2020-05-25 ENCOUNTER — Other Ambulatory Visit: Payer: Self-pay | Admitting: *Deleted

## 2020-05-25 DIAGNOSIS — J4520 Mild intermittent asthma, uncomplicated: Secondary | ICD-10-CM

## 2020-05-25 MED ORDER — BUDESONIDE-FORMOTEROL FUMARATE 80-4.5 MCG/ACT IN AERO: 2.0000 | INHALATION_SPRAY | Freq: Two times a day (BID) | RESPIRATORY_TRACT | 5 refills | Status: DC

## 2020-06-15 ENCOUNTER — Other Ambulatory Visit: Payer: Self-pay | Admitting: *Deleted

## 2020-06-15 DIAGNOSIS — F321 Major depressive disorder, single episode, moderate: Secondary | ICD-10-CM

## 2020-06-15 MED ORDER — ATORVASTATIN CALCIUM 10 MG PO TABS
10.0000 mg | ORAL_TABLET | Freq: Every day | ORAL | 0 refills | Status: DC
Start: 2020-06-15 — End: 2020-12-14

## 2020-06-15 MED ORDER — CITALOPRAM HYDROBROMIDE 20 MG PO TABS: 10.0000 mg | ORAL_TABLET | Freq: Every day | ORAL | 0 refills | Status: DC

## 2020-07-12 ENCOUNTER — Other Ambulatory Visit: Payer: Self-pay | Admitting: Family Medicine

## 2020-07-12 ENCOUNTER — Other Ambulatory Visit: Payer: Self-pay | Admitting: *Deleted

## 2020-07-12 DIAGNOSIS — L239 Allergic contact dermatitis, unspecified cause: Secondary | ICD-10-CM

## 2020-07-12 DIAGNOSIS — F321 Major depressive disorder, single episode, moderate: Secondary | ICD-10-CM

## 2020-07-12 MED ORDER — CITALOPRAM HYDROBROMIDE 20 MG PO TABS: 10.0000 mg | ORAL_TABLET | Freq: Every day | ORAL | 0 refills | Status: DC

## 2020-07-13 ENCOUNTER — Other Ambulatory Visit: Payer: Self-pay | Admitting: *Deleted

## 2020-07-13 NOTE — Telephone Encounter (Signed)
Je. NTBS 30 days given 06/15/20

## 2020-08-11 ENCOUNTER — Other Ambulatory Visit: Payer: Self-pay | Admitting: *Deleted

## 2020-08-11 DIAGNOSIS — F321 Major depressive disorder, single episode, moderate: Secondary | ICD-10-CM

## 2020-08-11 NOTE — Telephone Encounter (Signed)
Je. NTBS 30 days given 07/12/20

## 2020-08-11 NOTE — Telephone Encounter (Signed)
Call disconnected, subsequent calls went straight to voicemail with full mailbox

## 2020-08-31 ENCOUNTER — Ambulatory Visit: Payer: BC Managed Care – PPO | Admitting: Nurse Practitioner

## 2020-09-08 DIAGNOSIS — J4520 Mild intermittent asthma, uncomplicated: Secondary | ICD-10-CM | POA: Diagnosis not present

## 2020-09-08 DIAGNOSIS — R0989 Other specified symptoms and signs involving the circulatory and respiratory systems: Secondary | ICD-10-CM | POA: Diagnosis not present

## 2020-09-08 DIAGNOSIS — R509 Fever, unspecified: Secondary | ICD-10-CM | POA: Diagnosis not present

## 2020-09-08 DIAGNOSIS — R059 Cough, unspecified: Secondary | ICD-10-CM | POA: Diagnosis not present

## 2020-10-11 ENCOUNTER — Ambulatory Visit (INDEPENDENT_AMBULATORY_CARE_PROVIDER_SITE_OTHER): Payer: BC Managed Care – PPO | Admitting: Nurse Practitioner

## 2020-10-11 ENCOUNTER — Encounter: Payer: Self-pay | Admitting: Nurse Practitioner

## 2020-10-11 ENCOUNTER — Other Ambulatory Visit: Payer: Self-pay

## 2020-10-11 VITALS — BP 116/71 | HR 81 | Temp 98.1°F | Ht 64.0 in | Wt 193.0 lb

## 2020-10-11 DIAGNOSIS — J4520 Mild intermittent asthma, uncomplicated: Secondary | ICD-10-CM | POA: Diagnosis not present

## 2020-10-11 DIAGNOSIS — E039 Hypothyroidism, unspecified: Secondary | ICD-10-CM | POA: Diagnosis not present

## 2020-10-11 DIAGNOSIS — Z6832 Body mass index (BMI) 32.0-32.9, adult: Secondary | ICD-10-CM | POA: Diagnosis not present

## 2020-10-11 DIAGNOSIS — Z7951 Long term (current) use of inhaled steroids: Secondary | ICD-10-CM | POA: Diagnosis not present

## 2020-10-11 DIAGNOSIS — F321 Major depressive disorder, single episode, moderate: Secondary | ICD-10-CM

## 2020-10-11 DIAGNOSIS — Z79890 Hormone replacement therapy (postmenopausal): Secondary | ICD-10-CM | POA: Diagnosis not present

## 2020-10-11 DIAGNOSIS — E611 Iron deficiency: Secondary | ICD-10-CM

## 2020-10-11 DIAGNOSIS — E669 Obesity, unspecified: Secondary | ICD-10-CM | POA: Diagnosis not present

## 2020-10-11 DIAGNOSIS — F419 Anxiety disorder, unspecified: Secondary | ICD-10-CM | POA: Diagnosis not present

## 2020-10-11 DIAGNOSIS — R5383 Other fatigue: Secondary | ICD-10-CM

## 2020-10-11 DIAGNOSIS — K219 Gastro-esophageal reflux disease without esophagitis: Secondary | ICD-10-CM | POA: Diagnosis not present

## 2020-10-11 DIAGNOSIS — L239 Allergic contact dermatitis, unspecified cause: Secondary | ICD-10-CM

## 2020-10-11 DIAGNOSIS — R9431 Abnormal electrocardiogram [ECG] [EKG]: Secondary | ICD-10-CM | POA: Diagnosis not present

## 2020-10-11 DIAGNOSIS — Z20822 Contact with and (suspected) exposure to covid-19: Secondary | ICD-10-CM | POA: Diagnosis not present

## 2020-10-11 DIAGNOSIS — D649 Anemia, unspecified: Secondary | ICD-10-CM | POA: Diagnosis not present

## 2020-10-11 DIAGNOSIS — R531 Weakness: Secondary | ICD-10-CM | POA: Diagnosis not present

## 2020-10-11 DIAGNOSIS — Z79899 Other long term (current) drug therapy: Secondary | ICD-10-CM | POA: Diagnosis not present

## 2020-10-11 LAB — HEMOGLOBIN, FINGERSTICK: Hemoglobin: 6.5 g/dL — CL (ref 11.1–15.9)

## 2020-10-11 MED ORDER — IRON (FERROUS SULFATE) 325 (65 FE) MG PO TABS: 325.0000 mg | ORAL_TABLET | Freq: Every day | ORAL | 2 refills | Status: DC

## 2020-10-11 MED ORDER — LORATADINE 10 MG PO TABS: 10.0000 mg | ORAL_TABLET | Freq: Every day | ORAL | 5 refills | Status: DC

## 2020-10-11 MED ORDER — CITALOPRAM HYDROBROMIDE 20 MG PO TABS: 10.0000 mg | ORAL_TABLET | Freq: Every day | ORAL | 0 refills | Status: DC

## 2020-10-11 MED ORDER — PANTOPRAZOLE SODIUM 40 MG PO TBEC: 40.0000 mg | DELAYED_RELEASE_TABLET | Freq: Every day | ORAL | 0 refills | Status: DC

## 2020-10-11 MED ORDER — LEVOTHYROXINE SODIUM 75 MCG PO TABS
75.0000 ug | ORAL_TABLET | Freq: Every day | ORAL | 0 refills | Status: DC
Start: 2020-10-11 — End: 2021-07-04

## 2020-10-11 MED ORDER — BUDESONIDE-FORMOTEROL FUMARATE 80-4.5 MCG/ACT IN AERO: 2.0000 | INHALATION_SPRAY | Freq: Two times a day (BID) | RESPIRATORY_TRACT | 5 refills | Status: DC

## 2020-10-11 NOTE — Progress Notes (Signed)
Acute Office Visit  Subjective:    Patient ID: MAHI ZABRISKIE, female    DOB: [birth year], 50 y.o.   MRN: [id number]  Chief Complaint  Patient presents with  . Anemia    Anemia Presents for initial visit. Symptoms include bruises/bleeds easily and malaise/fatigue. There has been no abdominal pain, confusion, fever, leg swelling or palpitations. Signs of blood loss that are not present include vaginal bleeding. Past treatments include nothing. There is no history of cancer, clotting disorder, heart failure, HIV/AIDS or recent surgery.     Past Medical History:  Diagnosis Date  . Asthma   . GERD (gastroesophageal reflux disease)     Past Surgical History:  Procedure Laterality Date  . KNEE SURGERY  1997  . TUBAL LIGATION      Family History  Problem Relation Age of Onset  . COPD Father   . Arthritis Father   . Cancer Maternal Grandmother        lung  . Vision loss Paternal Grandfather   . Diabetes Paternal Grandfather   . Heart disease Paternal Grandfather     Social History   Socioeconomic History  . Marital status: Married    Spouse name: Not on file  . Number of children: Not on file  . Years of education: Not on file  . Highest education level: Not on file  Occupational History  . Not on file  Tobacco Use  . Smoking status: Never Smoker  . Smokeless tobacco: Never Used  Vaping Use  . Vaping Use: Never used  Substance and Sexual Activity  . Alcohol use: Yes    Comment: occ  . Drug use: No  . Sexual activity: Yes    Birth control/protection: Surgical  Other Topics Concern  . Not on file  Social History Narrative  . Not on file   Social Determinants of Health   Financial Resource Strain: Not on file  Food Insecurity: Not on file  Transportation Needs: Not on file  Physical Activity: Not on file  Stress: Not on file  Social Connections: Not on file  Intimate Partner Violence: Not on file    Outpatient Medications Prior to Visit   Medication Sig Dispense Refill  . ALPRAZolam (XANAX) 0.25 MG tablet Take 1 tablet (0.25 mg total) by mouth 2 (two) times daily as needed for anxiety. 30 tablet 1  . budesonide-formoterol (SYMBICORT) 80-4.5 MCG/ACT inhaler Inhale 2 puffs into the lungs 2 (two) times daily. 1 each 5  . citalopram (CELEXA) 20 MG tablet Take 0.5-1 tablets (10-20 mg total) by mouth daily. (Needs to be seen before next refill) 30 tablet 0  . levothyroxine (SYNTHROID) 75 MCG tablet Take 1 tablet (75 mcg total) by mouth daily before breakfast. Needs to be seen before next refill 14 tablet 0  . loratadine (CLARITIN) 10 MG tablet TAKE 1 TABLET BY MOUTH EVERY DAY 30 tablet 5  . pantoprazole (PROTONIX) 40 MG tablet Take 1 tablet (40 mg total) by mouth daily. 14 tablet 0  . atorvastatin (LIPITOR) 10 MG tablet Take 1 tablet (10 mg total) by mouth daily. (Needs to be seen before next refill) (Patient not taking: Reported on 10/11/2020) 30 tablet 0   No facility-administered medications prior to visit.    Allergies  Allergen Reactions  . Asa [Aspirin] Swelling    Review of Systems  Constitutional: Positive for fatigue and malaise/fatigue. Negative for fever.  HENT: Negative.   Cardiovascular: Negative for palpitations.  Gastrointestinal: Negative for abdominal pain.  Genitourinary:  Negative for vaginal bleeding.  Hematological: Bruises/bleeds easily.  Psychiatric/Behavioral: Negative for confusion.  All other systems reviewed and are negative.      Objective:    Physical Exam Vitals and nursing note reviewed.  Constitutional:      General: She is awake.     Appearance: Normal appearance. She is well-groomed.     Interventions: Face mask in place.  HENT:     Head: Normocephalic.     Nose: Nose normal.  Eyes:     Conjunctiva/sclera: Conjunctivae normal.  Cardiovascular:     Rate and Rhythm: Normal rate and regular rhythm.     Pulses: Normal pulses.     Heart sounds: Normal heart sounds.  Pulmonary:      Effort: Pulmonary effort is normal.     Breath sounds: Normal breath sounds.  Abdominal:     General: Bowel sounds are normal.  Neurological:     Mental Status: She is alert and oriented to person, place, and time.  Psychiatric:        Behavior: Behavior normal. Behavior is cooperative.     BP 116/71   Pulse 81   Temp 98.1 F (36.7 C) (Temporal)   Ht _0  (1.626 m)   Wt 193 lb (87.5 kg)   SpO2 100%   BMI 33.13 kg/m  Wt Readings from Last 3 Encounters:  10/11/20 193 lb (87.5 kg)  04/22/20 203 lb 9.6 oz (92.4 kg)  09/06/18 202 lb (91.6 kg)    Health Maintenance Due  Topic Date Due  . Hepatitis C Screening  Never done  . HIV Screening  Never done  . COLONOSCOPY (Pts 45-7yr Insurance coverage will need to be confirmed)  Never done  . MAMMOGRAM  04/11/2019  . PAP SMEAR-Modifier  02/01/2020    There are no preventive care reminders to display for this patient.   Lab Results  Component Value Date   TSH 2.560 08/05/2019   Lab Results  Component Value Date   WBC 7.8 01/29/2019   HGB 11.2 01/29/2019   HCT 35.3 01/29/2019   MCV 77 (L) 01/29/2019   PLT 323 01/29/2019   Lab Results  Component Value Date   NA 139 01/29/2019   K 4.2 01/29/2019   CO2 24 01/29/2019   GLUCOSE 94 01/29/2019   BUN 8 01/29/2019   CREATININE 0.85 01/29/2019   BILITOT <0.2 01/29/2019   ALKPHOS 90 01/29/2019   AST 20 01/29/2019   ALT 23 01/29/2019   PROT 6.8 01/29/2019   ALBUMIN 3.7 (L) 01/29/2019   CALCIUM 8.9 01/29/2019   Lab Results  Component Value Date   CHOL 217 (H) 01/29/2019   Lab Results  Component Value Date   HDL 45 01/29/2019   Lab Results  Component Value Date   LDLCALC 138 (H) 01/29/2019   Lab Results  Component Value Date   TRIG 172 (H) 01/29/2019   Lab Results  Component Value Date   CHOLHDL 4.8 (H) 01/29/2019   Lab Results  Component Value Date   HGBA1C 5.7 01/29/2019       Assessment & Plan:   Problem List Items Addressed This Visit       Respiratory   Mild intermittent asthma without complication   Relevant Medications   budesonide-formoterol (SYMBICORT) 80-4.5 MCG/ACT inhaler     Endocrine   Hypothyroid   Relevant Medications   levothyroxine (SYNTHROID) 75 MCG tablet     Musculoskeletal and Integument   Allergic dermatitis   Relevant Medications  loratadine (CLARITIN) 10 MG tablet     Other   Depression, major, single episode, moderate (HCC)   Relevant Medications   citalopram (CELEXA) 20 MG tablet   Low iron - Primary    Patient is in clinic today with a low hemoglobin of 6.8.  She is reporting worsening fatigue in the last few weeks.  Repeat hemoglobin, results came back 6.5.  Encourage patient to go to emergency department.  GI referral to assess cause of blood loss.  Hemoccult kit given to patient.  Iron supplement ordered.  Education provided to patient with printed handouts given.  Follow-up for worsening unresolved symptoms.       Relevant Medications   Iron, Ferrous Sulfate, 325 (65 Fe) MG TABS   Other Relevant Orders   TSH   Hemoglobin, fingerstick   Fecal occult blood, imunochemical   Fatigue    Worsening fatigue in the last few weeks.  Completed TSH, hemoglobin.        Relevant Orders   TSH   Hemoglobin, fingerstick       Meds ordered this encounter  Medications  . Iron, Ferrous Sulfate, 325 (65 Fe) MG TABS    Sig: Take 325 mg by mouth daily.    Dispense:  30 tablet    Refill:  2    Order Specific Question:   Supervising Provider    Answer:   Janora Norlander [[id number]]  . citalopram (CELEXA) 20 MG tablet    Sig: Take 0.5-1 tablets (10-20 mg total) by mouth daily. (Needs to be seen before next refill)    Dispense:  30 tablet    Refill:  0    Order Specific Question:   Supervising Provider    Answer:   Janora Norlander [[id number]]  . levothyroxine (SYNTHROID) 75 MCG tablet    Sig: Take 1 tablet (75 mcg total) by mouth daily before breakfast. Needs to be seen before next  refill    Dispense:  14 tablet    Refill:  0    Order Specific Question:   Supervising Provider    Answer:   Janora Norlander [[id number]]  . loratadine (CLARITIN) 10 MG tablet    Sig: Take 1 tablet (10 mg total) by mouth daily.    Dispense:  30 tablet    Refill:  5    Order Specific Question:   Supervising Provider    Answer:   Janora Norlander [[id number]]  . pantoprazole (PROTONIX) 40 MG tablet    Sig: Take 1 tablet (40 mg total) by mouth daily.    Dispense:  14 tablet    Refill:  0    Order Specific Question:   Supervising Provider    Answer:   Janora Norlander [[id number]]  . budesonide-formoterol (SYMBICORT) 80-4.5 MCG/ACT inhaler    Sig: Inhale 2 puffs into the lungs 2 (two) times daily.    Dispense:  1 each    Refill:  5    Order Specific Question:   Supervising Provider    Answer:   Janora Norlander [[id number]]     Ivy Lynn, NP

## 2020-10-11 NOTE — Patient Instructions (Signed)
[employer] medicine (25th ed., pp. [phone]). [place], [place]: Elsevier.">  Anemia  Anemia is a condition in which there is not enough red blood cells or hemoglobin in the blood. Hemoglobin is a substance in red blood cells that carries oxygen. When you do not have enough red blood cells or hemoglobin (are anemic), your body cannot get enough oxygen and your organs may not work properly. As a result, you may feel very tired or have other problems. What are the causes? Common causes of anemia include:  Excessive bleeding. Anemia can be caused by excessive bleeding inside or outside the body, including bleeding from the intestines or from heavy menstrual periods in females.  Poor nutrition.  Long-lasting (chronic) kidney, thyroid, and liver disease.  Bone marrow disorders, spleen problems, and blood disorders.  Cancer and treatments for cancer.  HIV (human immunodeficiency virus) and AIDS (acquired immunodeficiency syndrome).  Infections, medicines, and autoimmune disorders that destroy red blood cells. What are the signs or symptoms? Symptoms of this condition include:  Minor weakness.  Dizziness.  Headache, or difficulties concentrating and sleeping.  Heartbeats that feel irregular or faster than normal (palpitations).  Shortness of breath, especially with exercise.  Pale skin, lips, and nails, or cold hands and feet.  Indigestion and nausea. Symptoms may occur suddenly or develop slowly. If your anemia is mild, you may not have symptoms. How is this diagnosed? This condition is diagnosed based on blood tests, your medical history, and a physical exam. In some cases, a test may be needed in which cells are removed from the soft tissue inside of a bone and looked at under a microscope (bone marrow biopsy). Your health care provider may also check your stool (feces) for blood and may do additional testing to look for the cause of your bleeding. Other tests may  include:  Imaging tests, such as a CT scan or MRI.  A procedure to see inside your esophagus and stomach (endoscopy).  A procedure to see inside your colon and rectum (colonoscopy). How is this treated? Treatment for this condition depends on the cause. If you continue to lose a lot of blood, you may need to be treated at a hospital. Treatment may include:  Taking supplements of iron, vitamin Q68, or folic acid.  Taking a hormone medicine (erythropoietin) that can help to stimulate red blood cell growth.  Having a blood transfusion. This may be needed if you lose a lot of blood.  Making changes to your diet.  Having surgery to remove your spleen. Follow these instructions at home:  Take over-the-counter and prescription medicines only as told by your health care provider.  Take supplements only as told by your health care provider.  Follow any diet instructions that you were given by your health care provider.  Keep all follow-up visits as told by your health care provider. This is important. Contact a health care provider if:  You develop new bleeding anywhere in the body. Get help right away if:  You are very weak.  You are short of breath.  You have pain in your abdomen or chest.  You are dizzy or feel faint.  You have trouble concentrating.  You have bloody stools, black stools, or tarry stools.  You vomit repeatedly or you vomit up blood. These symptoms may represent a serious problem that is an emergency. Do not wait to see if the symptoms will go away. Get medical help right away. Call your local emergency services (911 in the [place] Do not  drive yourself to the hospital. Summary  Anemia is a condition in which you do not have enough red blood cells or enough of a substance in your red blood cells that carries oxygen (hemoglobin).  Symptoms may occur suddenly or develop slowly.  If your anemia is mild, you may not have symptoms.  This condition is  diagnosed with blood tests, a medical history, and a physical exam. Other tests may be needed.  Treatment for this condition depends on the cause of the anemia. This information is not intended to replace advice given to you by your health care provider. Make sure you discuss any questions you have with your health care provider. Document Revised: 05/06/2019 Document Reviewed: 05/06/2019 Elsevier Patient Education  2021 [place]. Iron-Rich Diet  Iron is a mineral that helps your body to produce hemoglobin. Hemoglobin is a protein in red blood cells that carries oxygen to your body's tissues. Eating too little iron may cause you to feel weak and tired, and it can increase your risk of infection. Iron is naturally found in many foods, and many foods have iron added to them (iron-fortified foods). You may need to follow an iron-rich diet if you do not have enough iron in your body due to certain medical conditions. The amount of iron that you need each day depends on your age, your sex, and any medical conditions you have. Follow instructions from your health care provider or a diet and nutrition specialist (dietitian) about how much iron you should eat each day. What are tips for following this plan? Reading food labels  Check food labels to see how many milligrams (mg) of iron are in each serving. Cooking  Cook foods in pots and pans that are made from iron.  Take these steps to make it easier for your body to absorb iron from certain foods: ? Soak beans overnight before cooking. ? Soak whole grains overnight and drain them before using. ? Ferment flours before baking, such as by using yeast in bread dough. Meal planning  When you eat foods that contain iron, you should eat them with foods that are high in vitamin C. These include oranges, peppers, tomatoes, potatoes, and mango. Vitamin C helps your body to absorb iron. General information  Take iron supplements only as told by your  health care provider. An overdose of iron can be life-threatening. If you were prescribed iron supplements, take them with orange juice or a vitamin C supplement.  When you eat iron-fortified foods or take an iron supplement, you should also eat foods that naturally contain iron, such as meat, poultry, and fish. Eating naturally iron-rich foods helps your body to absorb the iron that is added to other foods or contained in a supplement.  Certain foods and drinks prevent your body from absorbing iron properly. Avoid eating these foods in the same meal as iron-rich foods or with iron supplements. These foods include: ? Coffee, black tea, and red wine. ? Milk, dairy products, and foods that are high in calcium. ? Beans and soybeans. ? Whole grains. What foods should I eat? Fruits Prunes. Raisins. Eat fruits high in vitamin C, such as oranges, grapefruits, and strawberries, alongside iron-rich foods. Vegetables Spinach (cooked). Green peas. Broccoli. Fermented vegetables. Eat vegetables high in vitamin C, such as leafy greens, potatoes, bell peppers, and tomatoes, alongside iron-rich foods. Grains Iron-fortified breakfast cereal. Iron-fortified whole-wheat bread. Enriched rice. Sprouted grains. Meats and other proteins Beef liver. Oysters. Beef. Shrimp. [place]. Chicken. [place]. Sardines. Chickpeas.  Nuts. Tofu. Pumpkin seeds. Beverages Tomato juice. Fresh orange juice. Prune juice. Hibiscus tea. Fortified instant breakfast shakes. Sweets and desserts Blackstrap molasses. Seasonings and condiments Tahini. Fermented soy sauce. Other foods Wheat germ. The items listed above may not be a complete list of recommended foods and beverages. Contact a dietitian for more information. What foods should I avoid? Grains Whole grains. Bran cereal. Bran flour. Oats. Meats and other proteins Soybeans. Products made from soy protein. Black beans. Lentils. Mung beans. Split peas. Dairy Milk. Cream. Cheese.  Yogurt. Cottage cheese. Beverages Coffee. Black tea. Red wine. Sweets and desserts Cocoa. Chocolate. Ice cream. Other foods Basil. Oregano. Large amounts of parsley. The items listed above may not be a complete list of foods and beverages to avoid. Contact a dietitian for more information. Summary  Iron is a mineral that helps your body to produce hemoglobin. Hemoglobin is a protein in red blood cells that carries oxygen to your body's tissues.  Iron is naturally found in many foods, and many foods have iron added to them (iron-fortified foods).  When you eat foods that contain iron, you should eat them with foods that are high in vitamin C. Vitamin C helps your body to absorb iron.  Certain foods and drinks prevent your body from absorbing iron properly, such as whole grains and dairy products. You should avoid eating these foods in the same meal as iron-rich foods or with iron supplements. This information is not intended to replace advice given to you by your health care provider. Make sure you discuss any questions you have with your health care provider. Document Revised: 05/11/2017 Document Reviewed: 04/24/2017 Elsevier Patient Education  2021 [employer].

## 2020-10-11 NOTE — Assessment & Plan Note (Signed)
Patient is in clinic today with a low hemoglobin of 6.8.  She is reporting worsening fatigue in the last few weeks.  Repeat hemoglobin, results came back 6.5.  Encourage patient to go to emergency department.  GI referral to assess cause of blood loss.  Hemoccult kit given to patient.  Iron supplement ordered.  Education provided to patient with printed handouts given.  Follow-up for worsening unresolved symptoms.

## 2020-10-11 NOTE — Assessment & Plan Note (Signed)
Worsening fatigue in the last few weeks.  Completed TSH, hemoglobin.

## 2020-10-12 DIAGNOSIS — E039 Hypothyroidism, unspecified: Secondary | ICD-10-CM | POA: Diagnosis not present

## 2020-10-12 DIAGNOSIS — K219 Gastro-esophageal reflux disease without esophagitis: Secondary | ICD-10-CM | POA: Diagnosis not present

## 2020-10-12 DIAGNOSIS — D649 Anemia, unspecified: Secondary | ICD-10-CM | POA: Diagnosis not present

## 2020-10-12 DIAGNOSIS — J4520 Mild intermittent asthma, uncomplicated: Secondary | ICD-10-CM | POA: Diagnosis not present

## 2020-10-12 LAB — TSH: TSH: 2.68 u[IU]/mL (ref 0.450–4.500)

## 2020-10-13 ENCOUNTER — Ambulatory Visit: Payer: BC Managed Care – PPO | Admitting: Nurse Practitioner

## 2020-10-19 DIAGNOSIS — D649 Anemia, unspecified: Secondary | ICD-10-CM | POA: Diagnosis not present

## 2020-10-19 DIAGNOSIS — Z6834 Body mass index (BMI) 34.0-34.9, adult: Secondary | ICD-10-CM | POA: Diagnosis not present

## 2020-10-19 DIAGNOSIS — K219 Gastro-esophageal reflux disease without esophagitis: Secondary | ICD-10-CM | POA: Diagnosis not present

## 2020-10-20 ENCOUNTER — Other Ambulatory Visit: Payer: Self-pay

## 2020-10-20 ENCOUNTER — Encounter: Payer: Self-pay | Admitting: Nurse Practitioner

## 2020-10-20 ENCOUNTER — Ambulatory Visit: Payer: BC Managed Care – PPO | Admitting: Nurse Practitioner

## 2020-10-20 VITALS — BP 138/87 | HR 80 | Temp 96.9°F | Ht 64.0 in | Wt 197.0 lb

## 2020-10-20 DIAGNOSIS — Z09 Encounter for follow-up examination after completed treatment for conditions other than malignant neoplasm: Secondary | ICD-10-CM | POA: Insufficient documentation

## 2020-10-20 DIAGNOSIS — D649 Anemia, unspecified: Secondary | ICD-10-CM | POA: Insufficient documentation

## 2020-10-20 DIAGNOSIS — E611 Iron deficiency: Secondary | ICD-10-CM

## 2020-10-20 DIAGNOSIS — F411 Generalized anxiety disorder: Secondary | ICD-10-CM | POA: Diagnosis not present

## 2020-10-20 MED ORDER — ALPRAZOLAM 0.25 MG PO TABS: 0.2500 mg | ORAL_TABLET | Freq: Two times a day (BID) | ORAL | 0 refills | Status: DC | PRN

## 2020-10-20 NOTE — Assessment & Plan Note (Signed)
Continue Xanax as prescribed anxiety well controlled on current dose. GAD-7 completed.  Rx refill sent to pharmacy.  Education provided to patient with printed handouts given.  Follow-up with worsening or unresolved symptoms.

## 2020-10-20 NOTE — Assessment & Plan Note (Signed)
Spittle discharge follow-up with medication administration instructions completed.  Repeat CBC results pending.

## 2020-10-20 NOTE — Assessment & Plan Note (Signed)
Repeat CBC results pending.

## 2020-10-20 NOTE — Progress Notes (Signed)
Established Patient Office Visit  Subjective:  Patient ID: Rebecca Pham, female    DOB: [birth year]  Age: 50 y.o. MRN: [id number]  CC:  Chief Complaint  Patient presents with  . Hospitalization Follow-up  . Anemia    HPI Rebecca Pham presents for for follow-up after hospital admission 10/11/2020 for anemia.  Patient was admitted to [facility] and treated for low hemoglobin.  Patient was transfused 2 packed red blood cells and hemoglobin returned to 8.8.  Patient was scheduled for an outpatient colonoscopy and EGD to further evaluate for any other causes of iron deficiency anemia.  Today in clinic patient is not reporting any worsening fatigue, headache or fever.  Completed hospital follow-up discharge instructions with medication administration instruction patient verbalized understanding.  Past Medical History:  Diagnosis Date  . Asthma   . GERD (gastroesophageal reflux disease)     Past Surgical History:  Procedure Laterality Date  . KNEE SURGERY  1997  . TUBAL LIGATION      Family History  Problem Relation Age of Onset  . COPD Father   . Arthritis Father   . Cancer Maternal Grandmother        lung  . Vision loss Paternal Grandfather   . Diabetes Paternal Grandfather   . Heart disease Paternal Grandfather     Social History   Socioeconomic History  . Marital status: Married    Spouse name: Not on file  . Number of children: Not on file  . Years of education: Not on file  . Highest education level: Not on file  Occupational History  . Not on file  Tobacco Use  . Smoking status: Never Smoker  . Smokeless tobacco: Never Used  Vaping Use  . Vaping Use: Never used  Substance and Sexual Activity  . Alcohol use: Yes    Comment: occ  . Drug use: No  . Sexual activity: Yes    Birth control/protection: Surgical  Other Topics Concern  . Not on file  Social History Narrative  . Not on file   Social Determinants of Health   Financial  Resource Strain: Not on file  Food Insecurity: Not on file  Transportation Needs: Not on file  Physical Activity: Not on file  Stress: Not on file  Social Connections: Not on file  Intimate Partner Violence: Not on file    Outpatient Medications Prior to Visit  Medication Sig Dispense Refill  . ALPRAZolam (XANAX) 0.25 MG tablet Take 1 tablet (0.25 mg total) by mouth 2 (two) times daily as needed for anxiety. 30 tablet 1  . budesonide-formoterol (SYMBICORT) 80-4.5 MCG/ACT inhaler Inhale 2 puffs into the lungs 2 (two) times daily. 1 each 5  . citalopram (CELEXA) 20 MG tablet Take 0.5-1 tablets (10-20 mg total) by mouth daily. (Needs to be seen before next refill) 30 tablet 0  . Iron, Ferrous Sulfate, 325 (65 Fe) MG TABS Take 325 mg by mouth daily. 30 tablet 2  . levothyroxine (SYNTHROID) 75 MCG tablet Take 1 tablet (75 mcg total) by mouth daily before breakfast. Needs to be seen before next refill 14 tablet 0  . loratadine (CLARITIN) 10 MG tablet Take 1 tablet (10 mg total) by mouth daily. 30 tablet 5  . pantoprazole (PROTONIX) 40 MG tablet Take 1 tablet (40 mg total) by mouth daily. 14 tablet 0  . atorvastatin (LIPITOR) 10 MG tablet Take 1 tablet (10 mg total) by mouth daily. (Needs to be seen before next refill) (Patient not  taking: No sig reported) 30 tablet 0   No facility-administered medications prior to visit.    Allergies  Allergen Reactions  . Asa [Aspirin] Swelling    ROS Review of Systems  Constitutional: Negative.   HENT: Negative.   Respiratory: Negative.   Cardiovascular: Negative.   Gastrointestinal: Negative.   Genitourinary: Negative.   Skin: Negative.   Psychiatric/Behavioral: Negative.   All other systems reviewed and are negative.     Objective:    Physical Exam Vitals and nursing note reviewed.  HENT:     Head: Normocephalic.     Nose: Nose normal.  Eyes:     Conjunctiva/sclera: Conjunctivae normal.  Cardiovascular:     Rate and Rhythm: Normal  rate and regular rhythm.     Pulses: Normal pulses.     Heart sounds: Normal heart sounds.  Pulmonary:     Effort: Pulmonary effort is normal.     Breath sounds: Normal breath sounds.  Abdominal:     General: Bowel sounds are normal.  Musculoskeletal:        General: Normal range of motion.  Skin:    Findings: No rash.  Neurological:     Mental Status: She is alert and oriented to person, place, and time.  Psychiatric:        Behavior: Behavior normal.     BP 138/87   Pulse 80   Temp (!) 96.9 F (36.1 C) (Temporal)   Ht 5\' 4"  (1.626 m)   Wt 197 lb (89.4 kg)   SpO2 99%   BMI 33.81 kg/m  Wt Readings from Last 3 Encounters:  10/20/20 197 lb (89.4 kg)  10/11/20 193 lb (87.5 kg)  04/22/20 203 lb 9.6 oz (92.4 kg)     Health Maintenance Due  Topic Date Due  . HIV Screening  Never done  . Hepatitis C Screening  Never done  . COLONOSCOPY (Pts 45-59yrs Insurance coverage will need to be confirmed)  Never done  . PAP SMEAR-Modifier  02/01/2020    There are no preventive care reminders to display for this patient.  Lab Results  Component Value Date   TSH 2.680 10/11/2020   Lab Results  Component Value Date   WBC 7.8 01/29/2019   HGB 11.2 01/29/2019   HCT 35.3 01/29/2019   MCV 77 (L) 01/29/2019   PLT 323 01/29/2019   Lab Results  Component Value Date   NA 139 01/29/2019   K 4.2 01/29/2019   CO2 24 01/29/2019   GLUCOSE 94 01/29/2019   BUN 8 01/29/2019   CREATININE 0.85 01/29/2019   BILITOT <0.2 01/29/2019   ALKPHOS 90 01/29/2019   AST 20 01/29/2019   ALT 23 01/29/2019   PROT 6.8 01/29/2019   ALBUMIN 3.7 (L) 01/29/2019   CALCIUM 8.9 01/29/2019   Lab Results  Component Value Date   CHOL 217 (H) 01/29/2019   Lab Results  Component Value Date   HDL 45 01/29/2019   Lab Results  Component Value Date   LDLCALC 138 (H) 01/29/2019   Lab Results  Component Value Date   TRIG 172 (H) 01/29/2019   Lab Results  Component Value Date   CHOLHDL 4.8 (H)  01/29/2019   Lab Results  Component Value Date   HGBA1C 5.7 01/29/2019      Assessment & Plan:   Problem List Items Addressed This Visit      Other   GAD (generalized anxiety disorder)    Continue Xanax as prescribed anxiety well controlled on current  dose. GAD-7 completed.  Rx refill sent to pharmacy.  Education provided to patient with printed handouts given.  Follow-up with worsening or unresolved symptoms.      Relevant Medications   ALPRAZolam (XANAX) 0.25 MG tablet   Low iron   Hospital discharge follow-up    Spittle discharge follow-up with medication administration instructions completed.  Repeat CBC results pending.      Low hemoglobin - Primary    Repeat CBC results pending.      Relevant Orders   CBC with Differential      Meds ordered this encounter  Medications  . ALPRAZolam (XANAX) 0.25 MG tablet    Sig: Take 1 tablet (0.25 mg total) by mouth 2 (two) times daily as needed for anxiety.    Dispense:  30 tablet    Refill:  0    Generalized anxiety    Order Specific Question:   Supervising Provider    Answer:   Raliegh Ip [[id number]]    Follow-up: Return in about 6 weeks (around 12/01/2020).   Daryll Drown, NP

## 2020-10-20 NOTE — Patient Instructions (Addendum)
[employer] medicine (25th ed., pp. [phone]). [place], [place]: Elsevier.">  Anemia  Anemia is a condition in which there is not enough red blood cells or hemoglobin in the blood. Hemoglobin is a substance in red blood cells that carries oxygen. When you do not have enough red blood cells or hemoglobin (are anemic), your body cannot get enough oxygen and your organs may not work properly. As a result, you may feel very tired or have other problems. What are the causes? Common causes of anemia include:  Excessive bleeding. Anemia can be caused by excessive bleeding inside or outside the body, including bleeding from the intestines or from heavy menstrual periods in females.  Poor nutrition.  Long-lasting (chronic) kidney, thyroid, and liver disease.  Bone marrow disorders, spleen problems, and blood disorders.  Cancer and treatments for cancer.  HIV (human immunodeficiency virus) and AIDS (acquired immunodeficiency syndrome).  Infections, medicines, and autoimmune disorders that destroy red blood cells. What are the signs or symptoms? Symptoms of this condition include:  Minor weakness.  Dizziness.  Headache, or difficulties concentrating and sleeping.  Heartbeats that feel irregular or faster than normal (palpitations).  Shortness of breath, especially with exercise.  Pale skin, lips, and nails, or cold hands and feet.  Indigestion and nausea. Symptoms may occur suddenly or develop slowly. If your anemia is mild, you may not have symptoms. How is this diagnosed? This condition is diagnosed based on blood tests, your medical history, and a physical exam. In some cases, a test may be needed in which cells are removed from the soft tissue inside of a bone and looked at under a microscope (bone marrow biopsy). Your health care provider may also check your stool (feces) for blood and may do additional testing to look for the cause of your bleeding. Other tests may  include:  Imaging tests, such as a CT scan or MRI.  A procedure to see inside your esophagus and stomach (endoscopy).  A procedure to see inside your colon and rectum (colonoscopy). How is this treated? Treatment for this condition depends on the cause. If you continue to lose a lot of blood, you may need to be treated at a hospital. Treatment may include:  Taking supplements of iron, vitamin Q68, or folic acid.  Taking a hormone medicine (erythropoietin) that can help to stimulate red blood cell growth.  Having a blood transfusion. This may be needed if you lose a lot of blood.  Making changes to your diet.  Having surgery to remove your spleen. Follow these instructions at home:  Take over-the-counter and prescription medicines only as told by your health care provider.  Take supplements only as told by your health care provider.  Follow any diet instructions that you were given by your health care provider.  Keep all follow-up visits as told by your health care provider. This is important. Contact a health care provider if:  You develop new bleeding anywhere in the body. Get help right away if:  You are very weak.  You are short of breath.  You have pain in your abdomen or chest.  You are dizzy or feel faint.  You have trouble concentrating.  You have bloody stools, black stools, or tarry stools.  You vomit repeatedly or you vomit up blood. These symptoms may represent a serious problem that is an emergency. Do not wait to see if the symptoms will go away. Get medical help right away. Call your local emergency services (911 in the [place] Do not  drive yourself to the hospital. Summary  Anemia is a condition in which you do not have enough red blood cells or enough of a substance in your red blood cells that carries oxygen (hemoglobin).  Symptoms may occur suddenly or develop slowly.  If your anemia is mild, you may not have symptoms.  This condition is  diagnosed with blood tests, a medical history, and a physical exam. Other tests may be needed.  Treatment for this condition depends on the cause of the anemia. This information is not intended to replace advice given to you by your health care provider. Make sure you discuss any questions you have with your health care provider. Document Revised: 05/06/2019 Document Reviewed: 05/06/2019 Elsevier Patient Education  2021 [place]. http://NIMH.NIH.Gov">  Generalized Anxiety Disorder, Adult Generalized anxiety disorder (GAD) is a mental health condition. Unlike normal worries, anxiety related to GAD is not triggered by a specific event. These worries do not fade or get better with time. GAD interferes with relationships, work, and school. GAD symptoms can vary from mild to severe. People with severe GAD can have intense waves of anxiety with physical symptoms that are similar to panic attacks. What are the causes? The exact cause of GAD is not known, but the following are believed to have an impact:  Differences in natural brain chemicals.  Genes passed down from parents to children.  Differences in the way threats are perceived.  Development during childhood.  Personality. What increases the risk? The following factors may make you more likely to develop this condition:  Being female.  Having a family history of anxiety disorders.  Being very shy.  Experiencing very stressful life events, such as the death of a loved one.  Having a very stressful family environment. What are the signs or symptoms? People with GAD often worry excessively about many things in their lives, such as their health and family. Symptoms may also include:  Mental and emotional symptoms: ? Worrying excessively about natural disasters. ? Fear of being late. ? Difficulty concentrating. ? Fears that others are judging your performance.  Physical symptoms: ? Fatigue. ? Headaches, muscle tension,  muscle twitches, trembling, or feeling shaky. ? Feeling like your heart is pounding or beating very fast. ? Feeling out of breath or like you cannot take a deep breath. ? Having trouble falling asleep or staying asleep, or experiencing restlessness. ? Sweating. ? Nausea, diarrhea, or irritable bowel syndrome (IBS).  Behavioral symptoms: ? Experiencing erratic moods or irritability. ? Avoidance of new situations. ? Avoidance of people. ? Extreme difficulty making decisions. How is this diagnosed? This condition is diagnosed based on your symptoms and medical history. You will also have a physical exam. Your health care provider may perform tests to rule out other possible causes of your symptoms. To be diagnosed with GAD, a person must have anxiety that:  Is out of his or her control.  Affects several different aspects of his or her life, such as work and relationships.  Causes distress that makes him or her unable to take part in normal activities.  Includes at least three symptoms of GAD, such as restlessness, fatigue, trouble concentrating, irritability, muscle tension, or sleep problems. Before your health care provider can confirm a diagnosis of GAD, these symptoms must be present more days than they are not, and they must last for 6 months or longer. How is this treated? This condition may be treated with:  Medicine. Antidepressant medicine is usually prescribed for long-term daily  control. Anti-anxiety medicines may be added in severe cases, especially when panic attacks occur.  Talk therapy (psychotherapy). Certain types of talk therapy can be helpful in treating GAD by providing support, education, and guidance. Options include: ? Cognitive behavioral therapy (CBT). People learn coping skills and self-calming techniques to ease their physical symptoms. They learn to identify unrealistic thoughts and behaviors and to replace them with more appropriate thoughts and  behaviors. ? Acceptance and commitment therapy (ACT). This treatment teaches people how to be mindful as a way to cope with unwanted thoughts and feelings. ? Biofeedback. This process trains you to manage your body's response (physiological response) through breathing techniques and relaxation methods. You will work with a therapist while machines are used to monitor your physical symptoms.  Stress management techniques. These include yoga, meditation, and exercise. A mental health specialist can help determine which treatment is best for you. Some people see improvement with one type of therapy. However, other people require a combination of therapies.   Follow these instructions at home: Lifestyle  Maintain a consistent routine and schedule.  Anticipate stressful situations. Create a plan, and allow extra time to work with your plan.  Practice stress management or self-calming techniques that you have learned from your therapist or your health care provider. General instructions  Take over-the-counter and prescription medicines only as told by your health care provider.  Understand that you are likely to have setbacks. Accept this and be kind to yourself as you persist to take better care of yourself.  Recognize and accept your accomplishments, even if you judge them as small.  Keep all follow-up visits as told by your health care provider. This is important. Contact a health care provider if:  Your symptoms do not get better.  Your symptoms get worse.  You have signs of depression, such as: ? A persistently sad or irritable mood. ? Loss of enjoyment in activities that used to bring you joy. ? Change in weight or eating. ? Changes in sleeping habits. ? Avoiding friends or family members. ? Loss of energy for normal tasks. ? Feelings of guilt or worthlessness. Get help right away if:  You have serious thoughts about hurting yourself or others. If you ever feel like you may  hurt yourself or others, or have thoughts about taking your own life, get help right away. Go to your nearest emergency department or:  Call your local emergency services (911 in the [place]  Call a suicide crisis helpline, such as the [place] at 657-555-8058. This is open 24 hours a day in the [place]  Text the Crisis Text Line at 417-442-1074 (in the [place].). Summary  Generalized anxiety disorder (GAD) is a mental health condition that involves worry that is not triggered by a specific event.  People with GAD often worry excessively about many things in their lives, such as their health and family.  GAD may cause symptoms such as restlessness, trouble concentrating, sleep problems, frequent sweating, nausea, diarrhea, headaches, and trembling or muscle twitching.  A mental health specialist can help determine which treatment is best for you. Some people see improvement with one type of therapy. However, other people require a combination of therapies. This information is not intended to replace advice given to you by your health care provider. Make sure you discuss any questions you have with your health care provider. Document Revised: 03/19/2019 Document Reviewed: 03/19/2019 Elsevier Patient Education  [place].

## 2020-10-21 LAB — CBC WITH DIFFERENTIAL/PLATELET
Basophils Absolute: 0.1 10*3/uL (ref 0.0–0.2)
Basos: 1 %
EOS (ABSOLUTE): 0.8 10*3/uL — ABNORMAL HIGH (ref 0.0–0.4)
Eos: 10 %
Hematocrit: 35.6 % (ref 34.0–46.6)
Hemoglobin: 10.3 g/dL — ABNORMAL LOW (ref 11.1–15.9)
Immature Grans (Abs): 0 10*3/uL (ref 0.0–0.1)
Immature Granulocytes: 0 %
Lymphocytes Absolute: 2.1 10*3/uL (ref 0.7–3.1)
Lymphs: 27 %
MCH: 20 pg — ABNORMAL LOW (ref 26.6–33.0)
MCHC: 28.9 g/dL — ABNORMAL LOW (ref 31.5–35.7)
MCV: 69 fL — ABNORMAL LOW (ref 79–97)
Monocytes Absolute: 0.7 10*3/uL (ref 0.1–0.9)
Monocytes: 9 %
Neutrophils Absolute: 4.3 10*3/uL (ref 1.4–7.0)
Neutrophils: 53 %
Platelets: 287 10*3/uL (ref 150–450)
RBC: 5.16 x10E6/uL (ref 3.77–5.28)
RDW: 28 % — ABNORMAL HIGH (ref 11.7–15.4)
WBC: 8 10*3/uL (ref 3.4–10.8)

## 2020-10-29 DIAGNOSIS — K317 Polyp of stomach and duodenum: Secondary | ICD-10-CM | POA: Diagnosis not present

## 2020-10-29 DIAGNOSIS — J4520 Mild intermittent asthma, uncomplicated: Secondary | ICD-10-CM | POA: Diagnosis not present

## 2020-10-29 DIAGNOSIS — K2970 Gastritis, unspecified, without bleeding: Secondary | ICD-10-CM | POA: Diagnosis not present

## 2020-10-29 DIAGNOSIS — D649 Anemia, unspecified: Secondary | ICD-10-CM | POA: Diagnosis not present

## 2020-10-29 DIAGNOSIS — D509 Iron deficiency anemia, unspecified: Secondary | ICD-10-CM | POA: Diagnosis not present

## 2020-10-29 DIAGNOSIS — K219 Gastro-esophageal reflux disease without esophagitis: Secondary | ICD-10-CM | POA: Diagnosis not present

## 2020-10-29 DIAGNOSIS — Z7951 Long term (current) use of inhaled steroids: Secondary | ICD-10-CM | POA: Diagnosis not present

## 2020-10-29 DIAGNOSIS — K5730 Diverticulosis of large intestine without perforation or abscess without bleeding: Secondary | ICD-10-CM | POA: Diagnosis not present

## 2020-10-29 DIAGNOSIS — D127 Benign neoplasm of rectosigmoid junction: Secondary | ICD-10-CM | POA: Diagnosis not present

## 2020-10-29 DIAGNOSIS — K644 Residual hemorrhoidal skin tags: Secondary | ICD-10-CM | POA: Diagnosis not present

## 2020-10-29 DIAGNOSIS — Q438 Other specified congenital malformations of intestine: Secondary | ICD-10-CM | POA: Diagnosis not present

## 2020-10-29 DIAGNOSIS — K449 Diaphragmatic hernia without obstruction or gangrene: Secondary | ICD-10-CM | POA: Diagnosis not present

## 2020-11-01 ENCOUNTER — Ambulatory Visit: Payer: BC Managed Care – PPO | Admitting: Nurse Practitioner

## 2020-11-05 ENCOUNTER — Encounter: Payer: Self-pay | Admitting: Nurse Practitioner

## 2020-11-23 ENCOUNTER — Telehealth: Payer: Self-pay | Admitting: Nurse Practitioner

## 2020-11-23 DIAGNOSIS — F321 Major depressive disorder, single episode, moderate: Secondary | ICD-10-CM

## 2020-11-23 MED ORDER — CITALOPRAM HYDROBROMIDE 20 MG PO TABS: 10.0000 mg | ORAL_TABLET | Freq: Every day | ORAL | 1 refills | Status: DC

## 2020-11-23 NOTE — Telephone Encounter (Signed)
  Prescription Request  11/23/2020  What is the name of the medication or equipment? Thyroid medication pt does not know name of medication citalopram (CELEXA) 20 MG tablet Have you contacted your pharmacy to request a refill? (if applicable) Yes  Which pharmacy would you like this sent to? [facility]    Patient notified that their request is being sent to the clinical staff for review and that they should receive a response within 2 business days.

## 2020-12-08 ENCOUNTER — Ambulatory Visit: Payer: BC Managed Care – PPO | Admitting: Nurse Practitioner

## 2020-12-10 ENCOUNTER — Ambulatory Visit: Payer: BC Managed Care – PPO | Admitting: Nurse Practitioner

## 2020-12-10 ENCOUNTER — Encounter: Payer: Self-pay | Admitting: Nurse Practitioner

## 2020-12-10 ENCOUNTER — Other Ambulatory Visit: Payer: Self-pay

## 2020-12-10 VITALS — BP 133/85 | HR 80 | Temp 97.4°F | Ht 64.0 in | Wt 202.0 lb

## 2020-12-10 DIAGNOSIS — R6889 Other general symptoms and signs: Secondary | ICD-10-CM | POA: Diagnosis not present

## 2020-12-10 DIAGNOSIS — E7800 Pure hypercholesterolemia, unspecified: Secondary | ICD-10-CM | POA: Diagnosis not present

## 2020-12-10 DIAGNOSIS — F411 Generalized anxiety disorder: Secondary | ICD-10-CM

## 2020-12-10 DIAGNOSIS — D649 Anemia, unspecified: Secondary | ICD-10-CM | POA: Diagnosis not present

## 2020-12-10 DIAGNOSIS — G4730 Sleep apnea, unspecified: Secondary | ICD-10-CM | POA: Diagnosis not present

## 2020-12-10 DIAGNOSIS — F321 Major depressive disorder, single episode, moderate: Secondary | ICD-10-CM | POA: Diagnosis not present

## 2020-12-10 NOTE — Assessment & Plan Note (Signed)
GAD-7 completed.  Symptoms well controlled on current medication.

## 2020-12-10 NOTE — Progress Notes (Signed)
Established Patient Office Visit  Subjective:  Patient ID: Rebecca Pham, female    DOB: 06-24-1970  Age: 50 y.o. MRN: [id number]  CC:  Chief Complaint  Patient presents with   Medical Management of Chronic Issues    HPI Rebecca Pham presents for Mixed hyperlipidemia  Pt presents with hyperlipidemia. Patient was diagnosed few months ago Compliance with treatment has been good; The patient is compliant with medications, maintains a low cholesterol diet , follows up as directed , and maintains an exercise regimen . The patient denies experiencing any hypercholesterolemia related symptoms.    Sleep Apnea: Patient presents with possible obstructive sleep apnea. Patent has a a few months history of symptoms of daytime fatigue. Patient generally gets approximately 5-6  hours of sleep per night, and states they generally have difficulty falling asleep. Snoring of moderate severity is present. Apneic episodes is present. Nasal obstruction is not present.  Patient has not had tonsillectomy.     Flowsheet Row Office Visit from 12/10/2020 in [facility]  PHQ-9 Total Score 4       GAD 7 : Generalized Anxiety Score 12/10/2020 10/20/2020 10/11/2020 04/22/2020  Nervous, Anxious, on Edge 0 3 1 0  Control/stop worrying 3 0 1 0  Worry too much - different things 3 1 1  0  Trouble relaxing 0 3 1 0  Restless 0 0 0 0  Easily annoyed or irritable 1 3 1  0  Afraid - awful might happen 0 1 0 0  Total GAD 7 Score 7 11 5  0  Anxiety Difficulty Not difficult at all Somewhat difficult Not difficult at all Not difficult at all     Past Medical History:  Diagnosis Date   Asthma    GERD (gastroesophageal reflux disease)     Past Surgical History:  Procedure Laterality Date   KNEE SURGERY  1997   TUBAL LIGATION      Family History  Problem Relation Age of Onset   COPD Father    Arthritis Father    Cancer Maternal Grandmother        lung   Vision loss Paternal  Grandfather    Diabetes Paternal Grandfather    Heart disease Paternal Grandfather     Social History   Socioeconomic History   Marital status: Married    Spouse name: Not on file   Number of children: Not on file   Years of education: Not on file   Highest education level: Not on file  Occupational History   Not on file  Tobacco Use   Smoking status: Never   Smokeless tobacco: Never  Vaping Use   Vaping Use: Never used  Substance and Sexual Activity   Alcohol use: Yes    Comment: occ   Drug use: No   Sexual activity: Yes    Birth control/protection: Surgical  Other Topics Concern   Not on file  Social History Narrative   Not on file   Social Determinants of Health   Financial Resource Strain: Not on file  Food Insecurity: Not on file  Transportation Needs: Not on file  Physical Activity: Not on file  Stress: Not on file  Social Connections: Not on file  Intimate Partner Violence: Not on file    Outpatient Medications Prior to Visit  Medication Sig Dispense Refill   ALPRAZolam (XANAX) 0.25 MG tablet Take 1 tablet (0.25 mg total) by mouth 2 (two) times daily as needed for anxiety. 30 tablet 0   atorvastatin (  LIPITOR) 10 MG tablet Take 1 tablet (10 mg total) by mouth daily. (Needs to be seen before next refill) (Patient not taking: No sig reported) 30 tablet 0   budesonide-formoterol (SYMBICORT) 80-4.5 MCG/ACT inhaler Inhale 2 puffs into the lungs 2 (two) times daily. 1 each 5   citalopram (CELEXA) 20 MG tablet Take 0.5-1 tablets (10-20 mg total) by mouth daily. (Needs to be seen before next refill) 30 tablet 1   Iron, Ferrous Sulfate, 325 (65 Fe) MG TABS Take 325 mg by mouth daily. 30 tablet 2   levothyroxine (SYNTHROID) 75 MCG tablet Take 1 tablet (75 mcg total) by mouth daily before breakfast. Needs to be seen before next refill (Patient not taking: Reported on 12/10/2020) 14 tablet 0   loratadine (CLARITIN) 10 MG tablet Take 1 tablet (10 mg total) by mouth daily. 30  tablet 5   pantoprazole (PROTONIX) 40 MG tablet Take 1 tablet (40 mg total) by mouth daily. 14 tablet 0   No facility-administered medications prior to visit.    Allergies  Allergen Reactions   Asa [Aspirin] Swelling    ROS Review of Systems  Constitutional: Negative.   HENT: Negative.    Respiratory: Negative.    Gastrointestinal: Negative.   Musculoskeletal: Negative.   Skin:  Negative for rash.  Psychiatric/Behavioral:  The patient is nervous/anxious.   All other systems reviewed and are negative.    Objective:    Physical Exam Psychiatric:        Attention and Perception: Attention and perception normal.        Mood and Affect: Mood is anxious and depressed.        Behavior: Behavior normal. Behavior is cooperative.        Cognition and Memory: Cognition and memory normal.        Judgment: Judgment normal.    BP 133/85   Pulse 80   Temp (!) 97.4 F (36.3 C) (Temporal)   Ht 5\' 4"  (1.626 m)   Wt 202 lb (91.6 kg)   SpO2 97%   BMI 34.67 kg/m  Wt Readings from Last 3 Encounters:  12/10/20 202 lb (91.6 kg)  10/20/20 197 lb (89.4 kg)  10/11/20 193 lb (87.5 kg)     Health Maintenance Due  Topic Date Due   Pneumococcal Vaccine 27-63 Years old (1 - PCV) Never done   Hepatitis C Screening  Never done   COLONOSCOPY (Pts 45-79yrs Insurance coverage will need to be confirmed)  Never done   PAP SMEAR-Modifier  02/01/2020   Zoster Vaccines- Shingrix (2 of 2) 05/29/2020   COVID-19 Vaccine (4 - Booster for Moderna series) 08/23/2020    There are no preventive care reminders to display for this patient.  Lab Results  Component Value Date   TSH 2.680 10/11/2020   Lab Results  Component Value Date   WBC 8.0 10/20/2020   HGB 10.3 (L) 10/20/2020   HCT 35.6 10/20/2020   MCV 69 (L) 10/20/2020   PLT 287 10/20/2020   Lab Results  Component Value Date   NA 139 01/29/2019   K 4.2 01/29/2019   CO2 24 01/29/2019   GLUCOSE 94 01/29/2019   BUN 8 01/29/2019    CREATININE 0.85 01/29/2019   BILITOT <0.2 01/29/2019   ALKPHOS 90 01/29/2019   AST 20 01/29/2019   ALT 23 01/29/2019   PROT 6.8 01/29/2019   ALBUMIN 3.7 (L) 01/29/2019   CALCIUM 8.9 01/29/2019   Lab Results  Component Value Date   CHOL 217 (  H) 01/29/2019   Lab Results  Component Value Date   HDL 45 01/29/2019   Lab Results  Component Value Date   LDLCALC 138 (H) 01/29/2019   Lab Results  Component Value Date   TRIG 172 (H) 01/29/2019   Lab Results  Component Value Date   CHOLHDL 4.8 (H) 01/29/2019   Lab Results  Component Value Date   HGBA1C 5.7 01/29/2019      Assessment & Plan:   Problem List Items Addressed This Visit       Respiratory   Sleep apnea - Primary    Patient is reporting worsening sleep apnea.  Associated with interrupted sleep and waking up tired.  Referral completed to sleep study, education provided to patient printed handouts given.  Follow-up as needed       Relevant Orders   PSG Sleep Study     Other   Depression, major, single episode, moderate (HCC)    Completed PHQ-9.  Symptoms well controlled on Celexa 20 mg tablet by mouth daily.       GAD (generalized anxiety disorder)    GAD-7 completed.  Symptoms well controlled on current medication.       Low hemoglobin    History of low hemoglobin completed CBC, and B12.       Relevant Orders   B12 and Folate Panel   CBC with Differential   Comprehensive metabolic panel   Elevated cholesterol    Labs completed lipid panel, results pending. Continue low-cholesterol diet and exercise.       Relevant Orders   Lipid panel    No orders of the defined types were placed in this encounter.   Follow-up: Return in about 3 months (around 03/12/2021).    Daryll Drown, NP

## 2020-12-10 NOTE — Assessment & Plan Note (Signed)
Patient is reporting worsening sleep apnea.  Associated with interrupted sleep and waking up tired.  Referral completed to sleep study, education provided to patient printed handouts given.  Follow-up as needed

## 2020-12-10 NOTE — Assessment & Plan Note (Signed)
Labs completed lipid panel, results pending. Continue low-cholesterol diet and exercise.

## 2020-12-10 NOTE — Patient Instructions (Signed)
Sleep Apnea Sleep apnea affects breathing during sleep. It causes breathing to stop for 10 seconds or more, or to become shallow. People with sleep apnea usually snoreloudly. It can also increase the risk of: Heart attack. Stroke. Being very overweight (obese). Diabetes. Heart failure. Irregular heartbeat. High blood pressure. The goal of treatment is to help you breathe normally again. What are the causes?  The most common cause of this condition is a collapsed or blocked airway. There are three kinds of sleep apnea: Obstructive sleep apnea. This is caused by a blocked or collapsed airway. Central sleep apnea. This happens when the brain does not send the right signals to the muscles that control breathing. Mixed sleep apnea. This is a combination of obstructive and central sleep apnea. What increases the risk? Being overweight. Smoking. Having a small airway. Being older. Being female. Drinking alcohol. Taking medicines to calm yourself (sedatives or tranquilizers). Having family members with the condition. Having a tongue or tonsils that are larger than normal. What are the signs or symptoms? Trouble staying asleep. Loud snoring. Headaches in the morning. Waking up gasping. Dry mouth or sore throat in the morning. Being sleepy or tired during the day. If you are sleepy or tired during the day, you may also: Not be able to focus your mind (concentrate). Forget things. Get angry a lot and have mood swings. Feel sad (depressed). Have changes in your personality. Have less interest in sex, if you are female. Be unable to have an erection, if you are female. How is this treated?  Sleeping on your side. Using a medicine to get rid of mucus in your nose (decongestant). Avoiding the use of alcohol, medicines to help you relax, or certain pain medicines (narcotics). Losing weight, if needed. Changing your diet. Quitting smoking. Using a machine to open your airway while you  sleep, such as: An oral appliance. This is a mouthpiece that shifts your lower jaw forward. A CPAP device. This device blows air through a mask when you breathe out (exhale). An EPAP device. This has valves that you put in each nostril. A BPAP device. This device blows air through a mask when you breathe in (inhale) and breathe out. Having surgery if other treatments do not work. Follow these instructions at home: Lifestyle Make changes that your doctor recommends. Eat a healthy diet. Lose weight if needed. Avoid alcohol, medicines to help you relax, and some pain medicines. Do not smoke or use any products that contain nicotine or tobacco. If you need help quitting, ask your doctor. General instructions Take over-the-counter and prescription medicines only as told by your doctor. If you were given a machine to use while you sleep, use it only as told by your doctor. If you are having surgery, make sure to tell your doctor you have sleep apnea. You may need to bring your device with you. Keep all follow-up visits. Contact a doctor if: The machine that you were given to use during sleep bothers you or does not seem to be working. You do not get better. You get worse. Get help right away if: Your chest hurts. You have trouble breathing in enough air. You have an uncomfortable feeling in your back, arms, or stomach. You have trouble talking. One side of your body feels weak. A part of your face is hanging down. These symptoms may be an emergency. Get help right away. Call your local emergency services (911 in the [place] Do not wait to see if the symptoms   will go away. Do not drive yourself to the hospital. Summary This condition affects breathing during sleep. The most common cause is a collapsed or blocked airway. The goal of treatment is to help you breathe normally while you sleep. This information is not intended to replace advice given to you by your health care provider. Make  sure you discuss any questions you have with your healthcare provider. Document Revised: 05/07/2020 Document Reviewed: 05/07/2020 Elsevier Patient Education  2022 [employer] High Cholesterol  High cholesterol is a condition in which the blood has high levels of a white, waxy substance similar to fat (cholesterol). The liver makes all the cholesterol that the body needs. The human body needs small amounts of cholesterol to help build cells. A person gets extra orexcess cholesterol from the food that he or she eats. The blood carries cholesterol from the liver to the rest of the body. If you have high cholesterol, deposits (plaques) may build up on the walls of your arteries. Arteries are the blood vessels that carry blood away from your heart. These plaques make the arteries narrowand stiff. Cholesterol plaques increase your risk for heart attack and stroke. Work withyour health care provider to keep your cholesterol levels in a healthy range. What increases the risk? The following factors may make you more likely to develop this condition: Eating foods that are high in animal fat (saturated fat) or cholesterol. Being overweight. Not getting enough exercise. A family history of high cholesterol (familial hypercholesterolemia). Use of tobacco products. Having diabetes. What are the signs or symptoms? There are no symptoms of this condition. How is this diagnosed? This condition may be diagnosed based on the results of a blood test. If you are older than 51 years of age, your health care provider may check your cholesterol levels every 4-6 years. You may be checked more often if you have high cholesterol or other risk factors for heart disease. The blood test for cholesterol measures: "Bad" cholesterol, or LDL cholesterol. This is the main type of cholesterol that causes heart disease. The desired level is less than 100 mg/dL. "Good" cholesterol, or HDL cholesterol. HDL helps protect against  heart disease by cleaning the arteries and carrying the LDL to the liver for processing. The desired level for HDL is 60 mg/dL or higher. Triglycerides. These are fats that your body can store or burn for energy. The desired level is less than 150 mg/dL. Total cholesterol. This measures the total amount of cholesterol in your blood and includes LDL, HDL, and triglycerides. The desired level is less than 200 mg/dL. How is this treated? This condition may be treated with: Diet changes. You may be asked to eat foods that have more fiber and less saturated fats or added sugar. Lifestyle changes. These may include regular exercise, maintaining a healthy weight, and quitting use of tobacco products. Medicines. These are given when diet and lifestyle changes have not worked. You may be prescribed a statin medicine to help lower your cholesterol levels. Follow these instructions at home: Eating and drinking  Eat a healthy, balanced diet. This diet includes: Daily servings of a variety of fresh, frozen, or canned fruits and vegetables. Daily servings of whole grain foods that are rich in fiber. Foods that are low in saturated fats and trans fats. These include poultry and fish without skin, lean cuts of meat, and low-fat dairy products. A variety of fish, especially oily fish that contain omega-3 fatty acids. Aim to eat fish at least 2  times a week. Avoid foods and drinks that have added sugar. Use healthy cooking methods, such as roasting, grilling, broiling, baking, poaching, steaming, and stir-frying. Do not fry your food except for stir-frying.  Lifestyle  Get regular exercise. Aim to exercise for a total of 150 minutes a week. Increase your activity level by doing activities such as gardening, walking, and taking the stairs. Do not use any products that contain nicotine or tobacco, such as cigarettes, e-cigarettes, and chewing tobacco. If you need help quitting, ask your health care  provider.  General instructions Take over-the-counter and prescription medicines only as told by your health care provider. Keep all follow-up visits as told by your health care provider. This is important. Where to find more information American Heart Association: www.heart.org National Heart, Lung, and Blood Institute: [employer]Ste[employer].is Contact a health care provider if: You have trouble achieving or maintaining a healthy diet or weight. You are starting an exercise program. You are unable to stop smoking. Get help right away if: You have chest pain. You have trouble breathing. You have any symptoms of a stroke. "BE FAST" is an easy way to remember the main warning signs of a stroke: B - Balance. Signs are dizziness, sudden trouble walking, or loss of balance. E - Eyes. Signs are trouble seeing or a sudden change in vision. F - Face. Signs are sudden weakness or numbness of the face, or the face or eyelid drooping on one side. A - Arms. Signs are weakness or numbness in an arm. This happens suddenly and usually on one side of the body. S - Speech. Signs are sudden trouble speaking, slurred speech, or trouble understanding what people say. T - Time. Time to call emergency services. Write down what time symptoms started. You have other signs of a stroke, such as: A sudden, severe headache with no known cause. Nausea or vomiting. Seizure. These symptoms may represent a serious problem that is an emergency. Do not wait to see if the symptoms will go away. Get medical help right away. Call your local emergency services (911 in the [place] Do not drive yourself to the hospital. Summary Cholesterol plaques increase your risk for heart attack and stroke. Work with your health care provider to keep your cholesterol levels in a healthy range. Eat a healthy, balanced diet, get regular exercise, and maintain a healthy weight. Do not use any products that contain nicotine or tobacco, such as  cigarettes, e-cigarettes, and chewing tobacco. Get help right away if you have any symptoms of a stroke. This information is not intended to replace advice given to you by your health care provider. Make sure you discuss any questions you have with your healthcare provider. Document Revised: 04/28/2019 Document Reviewed: 04/28/2019 Elsevier Patient Education  2022 [employer].

## 2020-12-10 NOTE — Assessment & Plan Note (Signed)
History of low hemoglobin completed CBC, and B12.

## 2020-12-10 NOTE — Assessment & Plan Note (Signed)
Completed PHQ-9.  Symptoms well controlled on Celexa 20 mg tablet by mouth daily.

## 2020-12-11 LAB — CBC WITH DIFFERENTIAL/PLATELET
Basophils Absolute: 0.1 10*3/uL (ref 0.0–0.2)
Basos: 1 %
EOS (ABSOLUTE): 0.6 10*3/uL — ABNORMAL HIGH (ref 0.0–0.4)
Eos: 8 %
Hematocrit: 41.4 % (ref 34.0–46.6)
Hemoglobin: 13 g/dL (ref 11.1–15.9)
Immature Grans (Abs): 0 10*3/uL (ref 0.0–0.1)
Immature Granulocytes: 0 %
Lymphocytes Absolute: 2.1 10*3/uL (ref 0.7–3.1)
Lymphs: 28 %
MCH: 26.5 pg — ABNORMAL LOW (ref 26.6–33.0)
MCHC: 31.4 g/dL — ABNORMAL LOW (ref 31.5–35.7)
MCV: 85 fL (ref 79–97)
Monocytes Absolute: 0.6 10*3/uL (ref 0.1–0.9)
Monocytes: 9 %
Neutrophils Absolute: 4 10*3/uL (ref 1.4–7.0)
Neutrophils: 54 %
Platelets: 269 10*3/uL (ref 150–450)
RBC: 4.9 x10E6/uL (ref 3.77–5.28)
WBC: 7.5 10*3/uL (ref 3.4–10.8)

## 2020-12-11 LAB — COMPREHENSIVE METABOLIC PANEL
ALT: 28 IU/L (ref 0–32)
AST: 17 IU/L (ref 0–40)
Albumin/Globulin Ratio: 1.3 (ref 1.2–2.2)
Albumin: 3.9 g/dL (ref 3.8–4.9)
Alkaline Phosphatase: 96 IU/L (ref 44–121)
BUN/Creatinine Ratio: 9 (ref 9–23)
BUN: 8 mg/dL (ref 6–24)
Bilirubin Total: 0.2 mg/dL (ref 0.0–1.2)
CO2: 24 mmol/L (ref 20–29)
Calcium: 9 mg/dL (ref 8.7–10.2)
Chloride: 102 mmol/L (ref 96–106)
Creatinine, Ser: 0.89 mg/dL (ref 0.57–1.00)
Globulin, Total: 3 g/dL (ref 1.5–4.5)
Glucose: 112 mg/dL — ABNORMAL HIGH (ref 65–99)
Potassium: 4.3 mmol/L (ref 3.5–5.2)
Sodium: 141 mmol/L (ref 134–144)
Total Protein: 6.9 g/dL (ref 6.0–8.5)
eGFR: 78 mL/min/{1.73_m2} (ref >59)

## 2020-12-11 LAB — LIPID PANEL
Chol/HDL Ratio: 4.5 ratio — ABNORMAL HIGH (ref 0.0–4.4)
Cholesterol, Total: 223 mg/dL — ABNORMAL HIGH (ref 100–199)
HDL: 50 mg/dL (ref >39)
LDL Chol Calc (NIH): 147 mg/dL — ABNORMAL HIGH (ref 0–99)
Triglycerides: 144 mg/dL (ref 0–149)
VLDL Cholesterol Cal: 26 mg/dL (ref 5–40)

## 2020-12-11 LAB — B12 AND FOLATE PANEL
Folate: 4.1 ng/mL (ref >3.0)
Vitamin B-12: 213 pg/mL — ABNORMAL LOW (ref 232–1245)

## 2020-12-13 ENCOUNTER — Other Ambulatory Visit: Payer: Self-pay | Admitting: Nurse Practitioner

## 2020-12-13 DIAGNOSIS — R7309 Other abnormal glucose: Secondary | ICD-10-CM

## 2020-12-14 ENCOUNTER — Other Ambulatory Visit: Payer: Self-pay

## 2020-12-14 MED ORDER — ATORVASTATIN CALCIUM 10 MG PO TABS: 10.0000 mg | ORAL_TABLET | Freq: Every day | ORAL | 3 refills | Status: DC

## 2021-01-04 ENCOUNTER — Ambulatory Visit (INDEPENDENT_AMBULATORY_CARE_PROVIDER_SITE_OTHER): Payer: BC Managed Care – PPO | Admitting: Nurse Practitioner

## 2021-01-04 ENCOUNTER — Encounter: Payer: Self-pay | Admitting: Nurse Practitioner

## 2021-01-04 DIAGNOSIS — G4730 Sleep apnea, unspecified: Secondary | ICD-10-CM

## 2021-01-04 DIAGNOSIS — R5383 Other fatigue: Secondary | ICD-10-CM

## 2021-01-04 MED ORDER — CYANOCOBALAMIN 500 MCG PO TABS: 500.0000 ug | ORAL_TABLET | Freq: Every day | ORAL | 1 refills | Status: DC

## 2021-01-04 NOTE — Progress Notes (Signed)
   Virtual Visit  Note Due to COVID-19 pandemic this visit was conducted virtually. This visit type was conducted due to national recommendations for restrictions regarding the COVID-19 Pandemic (e.g. social distancing, sheltering in place) in an effort to limit this patient's exposure and mitigate transmission in our community. All issues noted in this document were discussed and addressed.  A physical exam was not performed with this format.  I connected with Rebecca Pham on 01/04/21 at 2:21 pm  by telephone and verified that I am speaking with the correct person using two identifiers. Rebecca Pham is currently located at home during visit. The provider, Daryll Drown, NP is located in their office at time of visit.  I discussed the limitations, risks, security and privacy concerns of performing an evaluation and management service by telephone and the availability of in person appointments. I also discussed with the patient that there may be a patient responsible charge related to this service. The patient expressed understanding and agreed to proceed.   History and Present Illness:  Shortness of Breath This is a recurrent problem. The current episode started more than 1 month ago. The problem occurs constantly. The problem has been unchanged. Pertinent negatives include no abdominal pain, chest pain, fever, headaches, leg pain or rash. Nothing aggravates the symptoms. She has tried nothing for the symptoms.     Review of Systems  Constitutional: Negative.  Negative for chills and fever.  Respiratory:  Positive for shortness of breath.   Cardiovascular:  Negative for chest pain.  Gastrointestinal:  Negative for abdominal pain.  Skin:  Negative for rash.  Neurological:  Negative for headaches.    Observations/Objective: Televisit patient not in distress.  Assessment and Plan: Completed referral to sleep study for ongoing sleep apnea and shortness of breath.  Started  patient on 500 mcg of vitamin B12 daily, recheck B12 levels in 8 weeks.  Follow Up Instructions: Follow-up with unresolved symptoms.    I discussed the assessment and treatment plan with the patient. The patient was provided an opportunity to ask questions and all were answered. The patient agreed with the plan and demonstrated an understanding of the instructions.   The patient was advised to call back or seek an in-person evaluation if the symptoms worsen or if the condition fails to improve as anticipated.  The above assessment and management plan was discussed with the patient. The patient verbalized understanding of and has agreed to the management plan. Patient is aware to call the clinic if symptoms persist or worsen. Patient is aware when to return to the clinic for a follow-up visit. Patient educated on when it is appropriate to go to the emergency department.   Time call ended:  2:30 pm  I provided 9 minutes of  non face-to-face time during this encounter.    Daryll Drown, NP

## 2021-01-13 ENCOUNTER — Telehealth: Payer: Self-pay | Admitting: Nurse Practitioner

## 2021-01-13 NOTE — Telephone Encounter (Signed)
Pt calling to make sure that it is okay if she takes the [facility] brand of B12 in 500 mg

## 2021-01-19 ENCOUNTER — Other Ambulatory Visit: Payer: Self-pay | Admitting: *Deleted

## 2021-01-19 DIAGNOSIS — F321 Major depressive disorder, single episode, moderate: Secondary | ICD-10-CM

## 2021-01-19 MED ORDER — CITALOPRAM HYDROBROMIDE 20 MG PO TABS: 10.0000 mg | ORAL_TABLET | Freq: Every day | ORAL | 0 refills | Status: DC

## 2021-03-07 ENCOUNTER — Other Ambulatory Visit: Payer: Self-pay | Admitting: Nurse Practitioner

## 2021-03-14 ENCOUNTER — Ambulatory Visit: Payer: BC Managed Care – PPO | Admitting: Nurse Practitioner

## 2021-03-21 DIAGNOSIS — Z23 Encounter for immunization: Secondary | ICD-10-CM | POA: Diagnosis not present

## 2021-04-01 ENCOUNTER — Other Ambulatory Visit: Payer: Self-pay | Admitting: Nurse Practitioner

## 2021-05-14 ENCOUNTER — Other Ambulatory Visit: Payer: Self-pay | Admitting: Nurse Practitioner

## 2021-05-14 DIAGNOSIS — F321 Major depressive disorder, single episode, moderate: Secondary | ICD-10-CM

## 2021-05-15 ENCOUNTER — Other Ambulatory Visit: Payer: Self-pay | Admitting: Nurse Practitioner

## 2021-05-16 NOTE — Telephone Encounter (Signed)
Je NTBS 30 days given 04/18/21

## 2021-05-17 ENCOUNTER — Encounter: Payer: Self-pay | Admitting: Nurse Practitioner

## 2021-05-17 NOTE — Telephone Encounter (Signed)
CALLED PT TO SCHEDULE MED REFILL APPT N/A VM FULL/LETTER MAILED

## 2021-05-23 ENCOUNTER — Telehealth: Payer: Self-pay | Admitting: Nurse Practitioner

## 2021-05-23 MED ORDER — PANTOPRAZOLE SODIUM 40 MG PO TBEC: 40.0000 mg | DELAYED_RELEASE_TABLET | Freq: Every day | ORAL | 0 refills | Status: DC

## 2021-05-23 NOTE — Telephone Encounter (Signed)
  Prescription Request  05/23/2021  Is this a "Controlled Substance" medicine? no  Have you seen your PCP in the last 2 weeks? No  If YES, route message to pool  -  If NO, patient needs to be scheduled for appointment.  What is the name of the medication or equipment? Heartburn meds (Pantoprazole)  Have you contacted your pharmacy to request a refill? no  Which pharmacy would you like this sent to? [facility]   Patient notified that their request is being sent to the clinical staff for review and that they should receive a response within 2 business days.    Je's pt.  I told pt to make an appt with Je, but she can't financially come right now.

## 2021-05-23 NOTE — Telephone Encounter (Signed)
Pt aware refill for 30 days sent to pharmacy and that she will need to make an appt in Jan w/ Je

## 2021-06-19 ENCOUNTER — Other Ambulatory Visit: Payer: Self-pay | Admitting: Nurse Practitioner

## 2021-06-20 NOTE — Telephone Encounter (Signed)
Patient has appt 1-13 for med refills with Je.

## 2021-06-20 NOTE — Telephone Encounter (Signed)
Je NTBS 30 days given 05/23/21

## 2021-06-22 ENCOUNTER — Other Ambulatory Visit: Payer: Self-pay | Admitting: Nurse Practitioner

## 2021-06-22 DIAGNOSIS — F321 Major depressive disorder, single episode, moderate: Secondary | ICD-10-CM

## 2021-06-24 ENCOUNTER — Ambulatory Visit: Payer: BC Managed Care – PPO | Admitting: Nurse Practitioner

## 2021-06-24 ENCOUNTER — Other Ambulatory Visit: Payer: Self-pay | Admitting: Nurse Practitioner

## 2021-06-24 ENCOUNTER — Encounter: Payer: Self-pay | Admitting: Nurse Practitioner

## 2021-06-24 VITALS — BP 133/81 | HR 89 | Temp 97.8°F | Resp 20 | Ht 64.0 in | Wt 217.0 lb

## 2021-06-24 DIAGNOSIS — F411 Generalized anxiety disorder: Secondary | ICD-10-CM

## 2021-06-24 DIAGNOSIS — K219 Gastro-esophageal reflux disease without esophagitis: Secondary | ICD-10-CM

## 2021-06-24 DIAGNOSIS — J4520 Mild intermittent asthma, uncomplicated: Secondary | ICD-10-CM | POA: Diagnosis not present

## 2021-06-24 DIAGNOSIS — Z79899 Other long term (current) drug therapy: Secondary | ICD-10-CM

## 2021-06-24 DIAGNOSIS — E7800 Pure hypercholesterolemia, unspecified: Secondary | ICD-10-CM

## 2021-06-24 DIAGNOSIS — L239 Allergic contact dermatitis, unspecified cause: Secondary | ICD-10-CM

## 2021-06-24 DIAGNOSIS — E611 Iron deficiency: Secondary | ICD-10-CM

## 2021-06-24 DIAGNOSIS — R5383 Other fatigue: Secondary | ICD-10-CM

## 2021-06-24 DIAGNOSIS — F321 Major depressive disorder, single episode, moderate: Secondary | ICD-10-CM

## 2021-06-24 DIAGNOSIS — G4730 Sleep apnea, unspecified: Secondary | ICD-10-CM

## 2021-06-24 MED ORDER — BUDESONIDE-FORMOTEROL FUMARATE 80-4.5 MCG/ACT IN AERO: 2.0000 | INHALATION_SPRAY | Freq: Two times a day (BID) | RESPIRATORY_TRACT | 5 refills | Status: DC

## 2021-06-24 MED ORDER — ATORVASTATIN CALCIUM 10 MG PO TABS: 10.0000 mg | ORAL_TABLET | Freq: Every day | ORAL | 1 refills | Status: DC

## 2021-06-24 MED ORDER — CYANOCOBALAMIN 500 MCG PO TABS: 500.0000 ug | ORAL_TABLET | Freq: Every day | ORAL | 1 refills | Status: AC

## 2021-06-24 MED ORDER — CITALOPRAM HYDROBROMIDE 20 MG PO TABS: 10.0000 mg | ORAL_TABLET | Freq: Every day | ORAL | 3 refills | Status: DC

## 2021-06-24 MED ORDER — LORATADINE 10 MG PO TABS: 10.0000 mg | ORAL_TABLET | Freq: Every day | ORAL | 5 refills | Status: DC

## 2021-06-24 MED ORDER — PANTOPRAZOLE SODIUM 40 MG PO TBEC: 40.0000 mg | DELAYED_RELEASE_TABLET | Freq: Every day | ORAL | 3 refills | Status: DC

## 2021-06-24 MED ORDER — IRON (FERROUS SULFATE) 325 (65 FE) MG PO TABS: 325.0000 mg | ORAL_TABLET | Freq: Every day | ORAL | 3 refills | Status: AC

## 2021-06-24 NOTE — Patient Instructions (Signed)
Gastroesophageal Reflux Disease, Adult Gastroesophageal reflux (GER) happens when acid from the stomach flows up into the tube that connects the mouth and the stomach (esophagus). Normally, food travels down the esophagus and stays in the stomach to be digested. With GER, food and stomach acid sometimes move back up into the esophagus. You may have a disease called gastroesophageal reflux disease (GERD) if the reflux: Happens often. Causes frequent or very bad symptoms. Causes problems such as damage to the esophagus. When this happens, the esophagus becomes sore and swollen. Over time, GERD can make small holes (ulcers) in the lining of the esophagus. What are the causes? This condition is caused by a problem with the muscle between the esophagus and the stomach. When this muscle is weak or not normal, it does not close properly to keep food and acid from coming back up from the stomach. The muscle can be weak because of: Tobacco use. Pregnancy. Having a certain type of hernia (hiatal hernia). Alcohol use. Certain foods and drinks, such as coffee, chocolate, onions, and peppermint. What increases the risk? Being overweight. Having a disease that affects your connective tissue. Taking NSAIDs, such a ibuprofen. What are the signs or symptoms? Heartburn. Difficult or painful swallowing. The feeling of having a lump in the throat. A bitter taste in the mouth. Bad breath. Having a lot of saliva. Having an upset or bloated stomach. Burping. Chest pain. Different conditions can cause chest pain. Make sure you see your doctor if you have chest pain. Shortness of breath or wheezing. A long-term cough or a cough at night. Wearing away of the surface of teeth (tooth enamel). Weight loss. How is this treated? Making changes to your diet. Taking medicine. Having surgery. Treatment will depend on how bad your symptoms are. Follow these instructions at home: Eating and drinking  Follow a  diet as told by your doctor. You may need to avoid foods and drinks such as: Coffee and tea, with or without caffeine. Drinks that contain alcohol. Energy drinks and sports drinks. Bubbly (carbonated) drinks or sodas. Chocolate and cocoa. Peppermint and mint flavorings. Garlic and onions. Horseradish. Spicy and acidic foods. These include peppers, chili powder, curry powder, vinegar, hot sauces, and BBQ sauce. Citrus fruit juices and citrus fruits, such as oranges, lemons, and limes. Tomato-based foods. These include red sauce, chili, salsa, and pizza with red sauce. Fried and fatty foods. These include donuts, french fries, potato chips, and high-fat dressings. High-fat meats. These include hot dogs, rib eye steak, sausage, ham, and bacon. High-fat dairy items, such as whole milk, butter, and cream cheese. Eat small meals often. Avoid eating large meals. Avoid drinking large amounts of liquid with your meals. Avoid eating meals during the 2-3 hours before bedtime. Avoid lying down right after you eat. Do not exercise right after you eat. Lifestyle  Do not smoke or use any products that contain nicotine or tobacco. If you need help quitting, ask your doctor. Try to lower your stress. If you need help doing this, ask your doctor. If you are overweight, lose an amount of weight that is healthy for you. Ask your doctor about a safe weight loss goal. General instructions Pay attention to any changes in your symptoms. Take over-the-counter and prescription medicines only as told by your doctor. Do not take aspirin, ibuprofen, or other NSAIDs unless your doctor says it is okay. Wear loose clothes. Do not wear anything tight around your waist. Raise (elevate) the head of your bed about 6  inches (15 cm). You may need to use a wedge to do this. Avoid bending over if this makes your symptoms worse. Keep all follow-up visits. Contact a doctor if: You have new symptoms. You lose weight and you  do not know why. You have trouble swallowing or it hurts to swallow. You have wheezing or a cough that keeps happening. You have a hoarse voice. Your symptoms do not get better with treatment. Get help right away if: You have sudden pain in your arms, neck, jaw, teeth, or back. You suddenly feel sweaty, dizzy, or light-headed. You have chest pain or shortness of breath. You vomit and the vomit is green, yellow, or black, or it looks like blood or coffee grounds. You faint. Your poop (stool) is red, bloody, or black. You cannot swallow, drink, or eat. These symptoms may represent a serious problem that is an emergency. Do not wait to see if the symptoms will go away. Get medical help right away. Call your local emergency services (911 in the [place] Do not drive yourself to the hospital. Summary If a person has gastroesophageal reflux disease (GERD), food and stomach acid move back up into the esophagus and cause symptoms or problems such as damage to the esophagus. Treatment will depend on how bad your symptoms are. Follow a diet as told by your doctor. Take all medicines only as told by your doctor. This information is not intended to replace advice given to you by your health care provider. Make sure you discuss any questions you have with your health care provider. Document Revised: 12/08/2019 Document Reviewed: 12/08/2019 Elsevier Patient Education  [place]. Major Depressive Disorder, Adult Major depressive disorder (MDD) is a mental health condition. It may also be called clinical depression or unipolar depression. MDD causes symptoms of sadness, hopelessness, and loss of interest in things. These symptoms last most of the day, almost every day, for 2 weeks. MDD can also cause physical symptoms. It can interfere with relationships and with everyday activities, such as work, school, and activities that are usually pleasant. MDD may be mild, moderate, or severe. It may be  single-episode MDD, which happens once, or recurrent MDD, which may occur multiple times. What are the causes? The exact cause of this condition is not known. MDD is most likely caused by a combination of things, which may include: Your personality traits. Learned or conditioned behaviors or thoughts or feelings that reinforce negativity. Any alcohol or substance misuse. Long-term (chronic) physical or mental health illness. Going through a traumatic experience or major life changes. What increases the risk? The following factors may make someone more likely to develop MDD: A family history of depression. Being a woman. Troubled family relationships. Abnormally low levels of certain brain chemicals. Traumatic or painful events in childhood, especially abuse or loss of a parent. A lot of stress from life experiences, such as poor living conditions or discrimination. Chronic physical illness or other mental health disorders. What are the signs or symptoms? The main symptoms of MDD usually include: Constant depressed or irritable mood. A loss of interest in things and activities. Other symptoms include: Sleeping or eating too much or too little. Unexplained weight gain or weight loss. Tiredness or low energy. Being agitated, restless, or weak. Feeling hopeless, worthless, or guilty. Trouble thinking clearly or making decisions. Thoughts of suicide or thoughts of harming others. Isolating oneself or avoiding other people or activities. Trouble completing tasks, work, or any normal obligations. Severe symptoms of this condition  may include: Psychotic depression.This may include false beliefs, or delusions. It may also include seeing, hearing, tasting, smelling, or feeling things that are not real (hallucinations). Chronic depression or persistent depressive disorder. This is low-level depression that lasts for at least 2 years. Melancholic depression, or feeling extremely sad and  hopeless. Catatonic depression, which includes trouble speaking and trouble moving. How is this diagnosed? This condition may be diagnosed based on: Your symptoms. Your medical and mental health history. You may be asked questions about your lifestyle, including any drug and alcohol use. A physical exam. Blood tests to rule out other conditions. MDD is confirmed if you have the following symptoms most of the day, nearly every day, in a 2-week period: Either a depressed mood or loss of interest. At least four other MDD symptoms. How is this treated? This condition is usually treated by mental health professionals, such as psychologists, psychiatrists, and clinical social workers. You may need more than one type of treatment. Treatment may include: Psychotherapy, also called talk therapy or counseling. Types of psychotherapy include: Cognitive behavioral therapy (CBT). This teaches you to recognize unhealthy feelings, thoughts, and behaviors, and replace them with positive thoughts and actions. Interpersonal therapy (IPT). This helps you to improve the way you communicate with others or relate to them. Family therapy. This treatment includes members of your family. Medicines to treat anxiety and depression. These medicines help to balance the brain chemicals that affect your emotions. Lifestyle changes. You may be asked to: Limit alcohol use and avoid drug use. Get regular exercise. Get plenty of sleep. Make healthy eating choices. Spend more time outdoors. Brain stimulation. This may be done if symptoms are very severe and other treatments have not worked. Examples of this treatment are electroconvulsive therapy and transcranial magnetic stimulation. Follow these instructions at home: Activity Exercise regularly and spend time outdoors. Find activities that you enjoy doing, and make time to do them. Find healthy ways to manage stress, such as: Meditation or deep breathing. Spending time  in nature. Journaling. Return to your normal activities as told by your health care provider. Ask your health care provider what activities are safe for you. Alcohol and drug use If you drink alcohol: Limit how much you use to: 0-1 drink a day for women who are not pregnant. 0-2 drinks a day for men. Be aware of how much alcohol is in your drink. In the [place] one drink equals one 12 oz bottle of beer (355 mL), one 5 oz glass of wine (148 mL), or one 1 oz glass of hard liquor (44 mL). Discuss your alcohol use with your health care provider. Alcohol can affect any antidepressant medicines you are taking. Discuss any drug use with your health care provider. General instructions  Take over-the-counter and prescription medicines only as told by your health care provider. Eat a healthy diet and get plenty of sleep. Consider joining a support group. Your health care provider may be able to recommend one. Keep all follow-up visits as told by your health care provider. This is important. Where to find more information [employer] on Mental Illness: www.nami.org [place] [employer] of Mental Health: http://www.maynard.net/ Contact a health care provider if: Your symptoms get worse. You develop new symptoms. Get help right away if: You self-harm. You have serious thoughts about hurting yourself or others. You hallucinate. If you ever feel like you may hurt yourself or others, or have thoughts about taking your own life, get help right away. Go to your  nearest emergency department or: Call your local emergency services (911 in the [place] Call a suicide crisis helpline, such as the [place] at 703-569-2940 or 988 in the [place]. This is open 24 hours a day in the [place] Text the Crisis Text Line at 458-775-7725 (in the [place].). Summary Major depressive disorder (MDD) is a mental health condition. MDD causes symptoms of sadness, hopelessness, and loss of interest in things.  These symptoms last most of the day, almost every day, for 2 weeks. The symptoms of MDD can interfere with relationships and with everyday activities. Treatments and support are available for people who develop MDD. You may need more than one type of treatment. Get help right away if you have serious thoughts about hurting yourself or others. This information is not intended to replace advice given to you by your health care provider. Make sure you discuss any questions you have with your health care provider. Document Revised: 12/22/2020 Document Reviewed: 05/10/2019 Elsevier Patient Education  2022 [place]. Generalized Anxiety Disorder, Adult Generalized anxiety disorder (GAD) is a mental health condition. Unlike normal worries, anxiety related to GAD is not triggered by a specific event. These worries do not fade or get better with time. GAD interferes with relationships, work, and school. GAD symptoms can vary from mild to severe. People with severe GAD can have intense waves of anxiety with physical symptoms that are similar to panic attacks. What are the causes? The exact cause of GAD is not known, but the following are believed to have an impact: Differences in natural brain chemicals. Genes passed down from parents to children. Differences in the way threats are perceived. Development and stress during childhood. Personality. What increases the risk? The following factors may make you more likely to develop this condition: Being female. Having a family history of anxiety disorders. Being very shy. Experiencing very stressful life events, such as the death of a loved one. Having a very stressful family environment. What are the signs or symptoms? People with GAD often worry excessively about many things in their lives, such as their health and family. Symptoms may also include: Mental and emotional symptoms: Worrying excessively about natural disasters. Fear of being  late. Difficulty concentrating. Fears that others are judging your performance. Physical symptoms: Fatigue. Headaches, muscle tension, muscle twitches, trembling, or feeling shaky. Feeling like your heart is pounding or beating very fast. Feeling out of breath or like you cannot take a deep breath. Having trouble falling asleep or staying asleep, or experiencing restlessness. Sweating. Nausea, diarrhea, or irritable bowel syndrome (IBS). Behavioral symptoms: Experiencing erratic moods or irritability. Avoidance of new situations. Avoidance of people. Extreme difficulty making decisions. How is this diagnosed? This condition is diagnosed based on your symptoms and medical history. You will also have a physical exam. Your health care provider may perform tests to rule out other possible causes of your symptoms. To be diagnosed with GAD, a person must have anxiety that: Is out of his or her control. Affects several different aspects of his or her life, such as work and relationships. Causes distress that makes him or her unable to take part in normal activities. Includes at least three symptoms of GAD, such as restlessness, fatigue, trouble concentrating, irritability, muscle tension, or sleep problems. Before your health care provider can confirm a diagnosis of GAD, these symptoms must be present more days than they are not, and they must last for 6 months or longer. How is this treated? This condition  may be treated with: Medicine. Antidepressant medicine is usually prescribed for long-term daily control. Anti-anxiety medicines may be added in severe cases, especially when panic attacks occur. Talk therapy (psychotherapy). Certain types of talk therapy can be helpful in treating GAD by providing support, education, and guidance. Options include: Cognitive behavioral therapy (CBT). People learn coping skills and self-calming techniques to ease their physical symptoms. They learn to identify  unrealistic thoughts and behaviors and to replace them with more appropriate thoughts and behaviors. Acceptance and commitment therapy (ACT). This treatment teaches people how to be mindful as a way to cope with unwanted thoughts and feelings. Biofeedback. This process trains you to manage your body's response (physiological response) through breathing techniques and relaxation methods. You will work with a therapist while machines are used to monitor your physical symptoms. Stress management techniques. These include yoga, meditation, and exercise. A mental health specialist can help determine which treatment is best for you. Some people see improvement with one type of therapy. However, other people require a combination of therapies. Follow these instructions at home: Lifestyle Maintain a consistent routine and schedule. Anticipate stressful situations. Create a plan and allow extra time to work with your plan. Practice stress management or self-calming techniques that you have learned from your therapist or your health care provider. Exercise regularly and spend time outdoors. Eat a healthy diet that includes plenty of vegetables, fruits, whole grains, low-fat dairy products, and lean protein. Do not eat a lot of foods that are high in fat, added sugar, or salt (sodium). Drink plenty of water. Avoid alcohol. Alcohol can increase anxiety. Avoid caffeine and certain over-the-counter cold medicines. These may make you feel worse. Ask your pharmacist which medicines to avoid. General instructions Take over-the-counter and prescription medicines only as told by your health care provider. Understand that you are likely to have setbacks. Accept this and be kind to yourself as you persist to take better care of yourself. Anticipate stressful situations. Create a plan and allow extra time to work with your plan. Recognize and accept your accomplishments, even if you judge them as small. Spend time  with people who care about you. Keep all follow-up visits. This is important. Where to find more information [employer] Mental Health: http://www.maynard.net/ Substance Abuse and Mental Health Services: SkateOasis.com.pt Contact a health care provider if: Your symptoms do not get better. Your symptoms get worse. You have signs of depression, such as: A persistently sad or irritable mood. Loss of enjoyment in activities that used to bring you joy. Change in weight or eating. Changes in sleeping habits. Get help right away if: You have thoughts about hurting yourself or others. If you ever feel like you may hurt yourself or others, or have thoughts about taking your own life, get help right away. Go to your nearest emergency department or: Call your local emergency services (911 in the [place] Call a suicide crisis helpline, such as the National Suicide Prevention Lifeline at (339) 424-8789 or 988 in the [place] This is open 24 hours a day in the [place] Text the Crisis Text Line at 971 370 7111 (in the [place] Summary Generalized anxiety disorder (GAD) is a mental health condition that involves worry that is not triggered by a specific event. People with GAD often worry excessively about many things in their lives, such as their health and family. GAD may cause symptoms such as restlessness, trouble concentrating, sleep problems, frequent sweating, nausea, diarrhea, headaches, and trembling or muscle twitching. A mental health specialist can  help determine which treatment is best for you. Some people see improvement with one type of therapy. However, other people require a combination of therapies. This information is not intended to replace advice given to you by your health care provider. Make sure you discuss any questions you have with your health care provider. Document Revised: 12/22/2020 Document Reviewed: 09/19/2020 Elsevier Patient Education  [place]. High Cholesterol High  cholesterol is a condition in which the blood has high levels of a white, waxy substance similar to fat (cholesterol). The liver makes all the cholesterol that the body needs. The human body needs small amounts of cholesterol to help build cells. A person gets extra or excess cholesterol from the food that he or she eats. The blood carries cholesterol from the liver to the rest of the body. If you have high cholesterol, deposits (plaques) may build up on the walls of your arteries. Arteries are the blood vessels that carry blood away from your heart. These plaques make the arteries narrow and stiff. Cholesterol plaques increase your risk for heart attack and stroke. Work with your health care provider to keep your cholesterol levels in a healthy range. What increases the risk? The following factors may make you more likely to develop this condition: Eating foods that are high in animal fat (saturated fat) or cholesterol. Being overweight. Not getting enough exercise. A family history of high cholesterol (familial hypercholesterolemia). Use of tobacco products. Having diabetes. What are the signs or symptoms? In most cases, high cholesterol does not usually cause any symptoms. In severe cases, very high cholesterol levels can cause: Fatty bumps under the skin (xanthomas). A white or gray ring around the black center (pupil) of the eye. How is this diagnosed? This condition may be diagnosed based on the results of a blood test. If you are older than 52 years of age, your health care provider may check your cholesterol levels every 4-6 years. You may be checked more often if you have high cholesterol or other risk factors for heart disease. The blood test for cholesterol measures: "Bad" cholesterol, or LDL cholesterol. This is the main type of cholesterol that causes heart disease. The desired level is less than 100 mg/dL (2.59 mmol/L). "Good" cholesterol, or HDL cholesterol. HDL helps protect  against heart disease by cleaning the arteries and carrying the LDL to the liver for processing. The desired level for HDL is 60 mg/dL (1.55 mmol/L) or higher. Triglycerides. These are fats that your body can store or burn for energy. The desired level is less than 150 mg/dL (1.69 mmol/L). Total cholesterol. This measures the total amount of cholesterol in your blood and includes LDL, HDL, and triglycerides. The desired level is less than 200 mg/dL (5.17 mmol/L). How is this treated? Treatment for high cholesterol starts with lifestyle changes, such as diet and exercise. Diet changes. You may be asked to eat foods that have more fiber and less saturated fats or added sugar. Lifestyle changes. These may include regular exercise, maintaining a healthy weight, and quitting use of tobacco products. Medicines. These are given when diet and lifestyle changes have not worked. You may be prescribed a statin medicine to help lower your cholesterol levels. Follow these instructions at home: Eating and drinking  Eat a healthy, balanced diet. This diet includes: Daily servings of a variety of fresh, frozen, or canned fruits and vegetables. Daily servings of whole grain foods that are rich in fiber. Foods that are low in saturated fats and trans  fats. These include poultry and fish without skin, lean cuts of meat, and low-fat dairy products. A variety of fish, especially oily fish that contain omega-3 fatty acids. Aim to eat fish at least 2 times a week. Avoid foods and drinks that have added sugar. Use healthy cooking methods, such as roasting, grilling, broiling, baking, poaching, steaming, and stir-frying. Do not fry your food except for stir-frying. If you drink alcohol: Limit how much you have to: 0-1 drink a day for women who are not pregnant. 0-2 drinks a day for men. Know how much alcohol is in a drink. In the [place] one drink equals one 12 oz bottle of beer (355 mL), one 5 oz glass of wine (148 mL),  or one 1 oz glass of hard liquor (44 mL). Lifestyle  Get regular exercise. Aim to exercise for a total of 150 minutes a week. Increase your activity level by doing activities such as gardening, walking, and taking the stairs. Do not use any products that contain nicotine or tobacco. These products include cigarettes, chewing tobacco, and vaping devices, such as e-cigarettes. If you need help quitting, ask your health care provider. General instructions Take over-the-counter and prescription medicines only as told by your health care provider. Keep all follow-up visits. This is important. Where to find more information American Heart Association: www.heart.org National Heart, Lung, and Blood Institute: https://wilson-eaton.com/ Contact a health care provider if: You have trouble achieving or maintaining a healthy diet or weight. You are starting an exercise program. You are unable to stop smoking. Get help right away if: You have chest pain. You have trouble breathing. You have discomfort or pain in your jaw, neck, back, shoulder, or arm. You have any symptoms of a stroke. "BE FAST" is an easy way to remember the main warning signs of a stroke: B - Balance. Signs are dizziness, sudden trouble walking, or loss of balance. E - Eyes. Signs are trouble seeing or a sudden change in vision. F - Face. Signs are sudden weakness or numbness of the face, or the face or eyelid drooping on one side. A - Arms. Signs are weakness or numbness in an arm. This happens suddenly and usually on one side of the body. S - Speech. Signs are sudden trouble speaking, slurred speech, or trouble understanding what people say. T - Time. Time to call emergency services. Write down what time symptoms started. You have other signs of a stroke, such as: A sudden, severe headache with no known cause. Nausea or vomiting. Seizure. These symptoms may represent a serious problem that is an emergency. Do not wait to see if the  symptoms will go away. Get medical help right away. Call your local emergency services (911 in the [place] Do not drive yourself to the hospital. Summary Cholesterol plaques increase your risk for heart attack and stroke. Work with your health care provider to keep your cholesterol levels in a healthy range. Eat a healthy, balanced diet, get regular exercise, and maintain a healthy weight. Do not use any products that contain nicotine or tobacco. These products include cigarettes, chewing tobacco, and vaping devices, such as e-cigarettes. Get help right away if you have any symptoms of a stroke. This information is not intended to replace advice given to you by your health care provider. Make sure you discuss any questions you have with your health care provider. Document Revised: 08/12/2020 Document Reviewed: 08/02/2020 Elsevier Patient Education  2022 [employer].

## 2021-06-24 NOTE — Assessment & Plan Note (Signed)
No new signs and symptoms of hypercholesterolemia.  Completed labs lipid panel results pending.  Follow-up in 6 months.

## 2021-06-24 NOTE — Assessment & Plan Note (Signed)
Continue Protonix 40 mg tablet by mouth daily.  No new signs and symptoms of worsening GERD.

## 2021-06-24 NOTE — Progress Notes (Signed)
Established Patient Office Visit  Subjective:  Patient ID: Rebecca Pham, female    DOB: 08-Sep-1970  Age: 51 y.o. MRN: [id number]  CC:  Chief Complaint  Patient presents with   Medical Management of Chronic Issues    6 mo    Anxiety    HPI Rebecca Pham presents for Mixed hyperlipidemia  Pt presents with hyperlipidemia. Patient was diagnosed in 7/1.2022. Compliance with treatment has been good The patient is compliant with medications, maintains a low cholesterol diet , follows up as directed , and maintains an exercise regimen . The patient denies experiencing any hypercholesterolemia related symptoms. Current symptoms Atorvastatin 40 mg tablet by mouth daily   Anxiety, Follow-up  She was last seen for anxiety 6 months ago. Changes made at last visit include no changes made to patients medication.   She reports good compliance with treatment. She reports fair tolerance of treatment. She is not having side effects  She feels her anxiety is moderate and Worse since last visit.  Symptoms: No chest pain Yes difficulty concentrating  No dizziness Yes fatigue  Yes feelings of losing control No insomnia  Yes irritable No palpitations  No panic attacks No racing thoughts  No shortness of breath No sweating  No tremors/shakes    GAD-7 Results GAD-7 Generalized Anxiety Disorder Screening Tool 06/24/2021 12/10/2020 10/20/2020  1. Feeling Nervous, Anxious, or on Edge 1 0 3  2. Not Being Able to Stop or Control Worrying 3 3 0  3. Worrying Too Much About Different Things '3 3 1  ' 4. Trouble Relaxing 3 0 3  5. Being So Restless it's Hard To Sit Still 1 0 0  6. Becoming Easily Annoyed or Irritable '3 1 3  ' 7. Feeling Afraid As If Something Awful Might Happen 0 0 1  Total GAD-7 Score '14 7 11  ' Difficulty At Work, Home, or Getting  Along With Others? Somewhat difficult Not difficult at all Somewhat difficult    PHQ-9 Scores PHQ9 SCORE ONLY 06/24/2021 12/10/2020 10/20/2020   PHQ-9 Total Score '18 4 7    ' Depression, Follow-up  She  was last seen for this 6 months ago. Changes made at last visit include no changes mad to medication dose.   She reports good compliance with treatment. She is not having side effects.   She reports good tolerance of treatment. Current symptoms include: depressed mood, difficulty concentrating, feelings of worthlessness/guilt, hopelessness, and insomnia She feels she is Worse since last visit.  Depression screen [facility] 2/9 06/24/2021 12/10/2020 10/20/2020  Decreased Interest '3 1 1  ' Down, Depressed, Hopeless '3 1 1  ' PHQ - 2 Score '6 2 2  ' Altered sleeping '3 1 3  ' Tired, decreased energy '3 1 1  ' Change in appetite 3 0 1  Feeling bad or failure about yourself  3 0 0  Trouble concentrating 0 0 0  Moving slowly or fidgety/restless - 0 0  Suicidal thoughts 0 - 0  PHQ-9 Score '18 4 7  ' Difficult doing work/chores Somewhat difficult - Not difficult at all    GERD, Follow up:  The patient was last seen for GERD 6 months ago. Changes made since that visit include : no changes, patient is on Protonix 40 mg tablet by mouth daily.  She reports good compliance with treatment. She is not having side effects. .  She IS experiencing no new symptoms. She is NOT experiencing abdominal bloating, belching, belching and eructation, bilious reflux, chest pain, or choking on food  Past Medical History:  Diagnosis Date   Asthma    GERD (gastroesophageal reflux disease)     Past Surgical History:  Procedure Laterality Date   KNEE SURGERY  1997   TUBAL LIGATION      Family History  Problem Relation Age of Onset   COPD Father    Arthritis Father    Cancer Maternal Grandmother        lung   Vision loss Paternal Grandfather    Diabetes Paternal Grandfather    Heart disease Paternal Grandfather     Social History   Socioeconomic History   Marital status: Married    Spouse name: Not on file   Number of children: Not on file   Years  of education: Not on file   Highest education level: Not on file  Occupational History   Not on file  Tobacco Use   Smoking status: Never   Smokeless tobacco: Never  Vaping Use   Vaping Use: Never used  Substance and Sexual Activity   Alcohol use: Yes    Comment: occ   Drug use: No   Sexual activity: Yes    Birth control/protection: Surgical  Other Topics Concern   Not on file  Social History Narrative   Not on file   Social Determinants of Health   Financial Resource Strain: Not on file  Food Insecurity: Not on file  Transportation Needs: Not on file  Physical Activity: Not on file  Stress: Not on file  Social Connections: Not on file  Intimate Partner Violence: Not on file    Outpatient Medications Prior to Visit  Medication Sig Dispense Refill   ALPRAZolam (XANAX) 0.25 MG tablet Take 1 tablet (0.25 mg total) by mouth 2 (two) times daily as needed for anxiety. 30 tablet 0   atorvastatin (LIPITOR) 10 MG tablet Take 1 tablet (10 mg total) by mouth daily. (Needs to be seen before next refill) 90 tablet 3   budesonide-formoterol (SYMBICORT) 80-4.5 MCG/ACT inhaler Inhale 2 puffs into the lungs 2 (two) times daily. 1 each 5   citalopram (CELEXA) 20 MG tablet Take 0.5-1 tablets (10-20 mg total) by mouth daily. 30 tablet 0   loratadine (CLARITIN) 10 MG tablet Take 1 tablet (10 mg total) by mouth daily. 30 tablet 5   pantoprazole (PROTONIX) 40 MG tablet Take 1 tablet (40 mg total) by mouth daily. (NEEDS TO BE SEEN BEFORE NEXT REFILL) 30 tablet 0   vitamin B-12 (CYANOCOBALAMIN) 500 MCG tablet Take 1 tablet (500 mcg total) by mouth daily. 90 tablet 1   levothyroxine (SYNTHROID) 75 MCG tablet Take 1 tablet (75 mcg total) by mouth daily before breakfast. Needs to be seen before next refill (Patient not taking: Reported on 06/24/2021) 14 tablet 0   Iron, Ferrous Sulfate, 325 (65 Fe) MG TABS Take 325 mg by mouth daily. (Patient not taking: Reported on 06/24/2021) 30 tablet 2   No  facility-administered medications prior to visit.    Allergies  Allergen Reactions   Asa [Aspirin] Swelling    ROS Review of Systems  HENT: Negative.    Eyes: Negative.   Respiratory: Negative.    Cardiovascular: Negative.  Negative for chest pain.  Gastrointestinal: Negative.   Musculoskeletal: Negative.   Skin: Negative.  Negative for rash.  Neurological: Negative.   Psychiatric/Behavioral:  The patient is nervous/anxious.   All other systems reviewed and are negative.    Objective:    Physical Exam Vitals and nursing note reviewed.  Constitutional:  Appearance: Normal appearance. She is obese.  HENT:     Head: Normocephalic.     Right Ear: External ear normal.     Left Ear: External ear normal.     Nose: Nose normal.     Mouth/Throat:     Mouth: Mucous membranes are moist.     Pharynx: Oropharynx is clear.  Eyes:     Conjunctiva/sclera: Conjunctivae normal.  Pulmonary:     Effort: Pulmonary effort is normal.     Breath sounds: Normal breath sounds.  Abdominal:     General: Bowel sounds are normal.  Musculoskeletal:        General: Normal range of motion.  Skin:    General: Skin is warm.     Findings: No rash.  Neurological:     Mental Status: She is alert and oriented to person, place, and time. Mental status is at baseline.  Psychiatric:        Attention and Perception: Attention normal.        Mood and Affect: Affect normal. Mood is anxious and depressed.        Behavior: Behavior normal. Behavior is cooperative.        Thought Content: Thought content normal.        Cognition and Memory: Cognition and memory normal.    BP 133/81    Pulse 89    Temp 97.8 F (36.6 C)    Resp 20    Ht '5\' 4"'  (1.626 m)    Wt 217 lb (98.4 kg)    LMP 06/25/2019 (Approximate) Comment: partial   SpO2 98%    BMI 37.25 kg/m  Wt Readings from Last 3 Encounters:  06/24/21 217 lb (98.4 kg)  12/10/20 202 lb (91.6 kg)  10/20/20 197 lb (89.4 kg)     Health Maintenance Due   Topic Date Due   COLONOSCOPY (Pts 45-88yr Insurance coverage will need to be confirmed)  Never done   PAP SMEAR-Modifier  02/01/2020   Zoster Vaccines- Shingrix (2 of 2) 05/29/2020    There are no preventive care reminders to display for this patient.  Lab Results  Component Value Date   TSH 2.680 10/11/2020   Lab Results  Component Value Date   WBC 7.5 12/10/2020   HGB 13.0 12/10/2020   HCT 41.4 12/10/2020   MCV 85 12/10/2020   PLT 269 12/10/2020   Lab Results  Component Value Date   NA 141 12/10/2020   K 4.3 12/10/2020   CO2 24 12/10/2020   GLUCOSE 112 (H) 12/10/2020   BUN 8 12/10/2020   CREATININE 0.89 12/10/2020   BILITOT 0.2 12/10/2020   ALKPHOS 96 12/10/2020   AST 17 12/10/2020   ALT 28 12/10/2020   PROT 6.9 12/10/2020   ALBUMIN 3.9 12/10/2020   CALCIUM 9.0 12/10/2020   EGFR 78 12/10/2020   Lab Results  Component Value Date   CHOL 223 (H) 12/10/2020   Lab Results  Component Value Date   HDL 50 12/10/2020   Lab Results  Component Value Date   LDLCALC 147 (H) 12/10/2020   Lab Results  Component Value Date   TRIG 144 12/10/2020   Lab Results  Component Value Date   CHOLHDL 4.5 (H) 12/10/2020   Lab Results  Component Value Date   HGBA1C 5.7 01/29/2019      Assessment & Plan:  Anxiety not well controlled on current medication.  Completed GAD-7.  Education provided to patient on stress management.  Recommendations to psych and counseling.  Patient is not willing to take recommendations at this time due to family dynamics and will hold off for situations to resolve.  Advised patient to follow-up with worsening unresolved symptoms.  Patient verbalized understanding. Problem List Items Addressed This Visit       Respiratory   Mild intermittent asthma without complication   Relevant Medications   budesonide-formoterol (SYMBICORT) 80-4.5 MCG/ACT inhaler   Sleep apnea   Relevant Medications   vitamin B-12 (CYANOCOBALAMIN) 500 MCG tablet      Digestive   Gastroesophageal reflux disease without esophagitis    Continue Protonix 40 mg tablet by mouth daily.  No new signs and symptoms of worsening GERD.      Relevant Medications   pantoprazole (PROTONIX) 40 MG tablet     Musculoskeletal and Integument   Allergic dermatitis   Relevant Medications   loratadine (CLARITIN) 10 MG tablet     Other   Depression, major, single episode, moderate (HCC)    Patient's symptoms worsened by family dynamics.  Provided education to patient on depression management, printed handouts given.  Patient is not willing to make changes to medication at this time and will prefer to wait until family situation changes.  Rx refill sent to pharmacy.  PHQ-9 completed  I offered patient counseling and psychiatric referral.      Relevant Medications   citalopram (CELEXA) 20 MG tablet   GAD (generalized anxiety disorder)   Relevant Medications   citalopram (CELEXA) 20 MG tablet   Low iron   Relevant Medications   Iron, Ferrous Sulfate, 325 (65 Fe) MG TABS   Fatigue   Relevant Medications   vitamin B-12 (CYANOCOBALAMIN) 500 MCG tablet   Elevated cholesterol - Primary    No new signs and symptoms of hypercholesterolemia.  Completed labs lipid panel results pending.  Follow-up in 6 months.      Relevant Medications   atorvastatin (LIPITOR) 10 MG tablet   Other Visit Diagnoses     Controlled substance agreement signed       Relevant Orders   CBC with Differential   Comprehensive metabolic panel   Lipid Panel   TSH   Drug Screen 10 W/Conf, Se       Meds ordered this encounter  Medications   atorvastatin (LIPITOR) 10 MG tablet    Sig: Take 1 tablet (10 mg total) by mouth daily. (Needs to be seen before next refill)    Dispense:  90 tablet    Refill:  1    Order Specific Question:   Supervising Provider    Answer:   Jeneen Rinks   budesonide-formoterol (SYMBICORT) 80-4.5 MCG/ACT inhaler    Sig: Inhale 2 puffs into the lungs 2  (two) times daily.    Dispense:  1 each    Refill:  5    Order Specific Question:   Supervising Provider    Answer:   Claretta Fraise [[id number]]   citalopram (CELEXA) 20 MG tablet    Sig: Take 0.5-1 tablets (10-20 mg total) by mouth daily.    Dispense:  30 tablet    Refill:  3    Order Specific Question:   Supervising Provider    Answer:   Jeneen Rinks   Iron, Ferrous Sulfate, 325 (65 Fe) MG TABS    Sig: Take 325 mg by mouth daily.    Dispense:  30 tablet    Refill:  3    Order Specific Question:   Supervising Provider    Answer:  Claretta Fraise [[id number]]   loratadine (CLARITIN) 10 MG tablet    Sig: Take 1 tablet (10 mg total) by mouth daily.    Dispense:  30 tablet    Refill:  5    Order Specific Question:   Supervising Provider    Answer:   Claretta Fraise [[id number]]   pantoprazole (PROTONIX) 40 MG tablet    Sig: Take 1 tablet (40 mg total) by mouth daily. (NEEDS TO BE SEEN BEFORE NEXT REFILL)    Dispense:  30 tablet    Refill:  3    Order Specific Question:   Supervising Provider    Answer:   Claretta Fraise [[id number]]   vitamin B-12 (CYANOCOBALAMIN) 500 MCG tablet    Sig: Take 1 tablet (500 mcg total) by mouth daily.    Dispense:  90 tablet    Refill:  1    Order Specific Question:   Supervising Provider    Answer:   Claretta Fraise [[id number]]    Follow-up: Return in about 6 months (around 12/22/2021).    Ivy Lynn, NP

## 2021-06-24 NOTE — Assessment & Plan Note (Signed)
Patient's symptoms worsened by family dynamics.  Provided education to patient on depression management, printed handouts given.  Patient is not willing to make changes to medication at this time and will prefer to wait until family situation changes.  Rx refill sent to pharmacy.  PHQ-9 completed  I offered patient counseling and psychiatric referral.

## 2021-06-29 DIAGNOSIS — M25462 Effusion, left knee: Secondary | ICD-10-CM | POA: Diagnosis not present

## 2021-06-29 DIAGNOSIS — M25562 Pain in left knee: Secondary | ICD-10-CM | POA: Diagnosis not present

## 2021-06-29 DIAGNOSIS — J45909 Unspecified asthma, uncomplicated: Secondary | ICD-10-CM | POA: Diagnosis not present

## 2021-06-29 DIAGNOSIS — E079 Disorder of thyroid, unspecified: Secondary | ICD-10-CM | POA: Diagnosis not present

## 2021-07-04 ENCOUNTER — Other Ambulatory Visit: Payer: Self-pay | Admitting: Nurse Practitioner

## 2021-07-04 DIAGNOSIS — E039 Hypothyroidism, unspecified: Secondary | ICD-10-CM

## 2021-07-04 MED ORDER — LEVOTHYROXINE SODIUM 88 MCG PO TABS
88.0000 ug | ORAL_TABLET | Freq: Every day | ORAL | 3 refills | Status: DC
Start: 2021-07-04 — End: 2022-02-27

## 2021-07-05 LAB — CBC WITH DIFFERENTIAL/PLATELET
Basophils Absolute: 0.1 10*3/uL (ref 0.0–0.2)
Basos: 1 %
EOS (ABSOLUTE): 0.6 10*3/uL — ABNORMAL HIGH (ref 0.0–0.4)
Eos: 9 %
Hematocrit: 38.4 % (ref 34.0–46.6)
Hemoglobin: 13.1 g/dL (ref 11.1–15.9)
Immature Grans (Abs): 0 10*3/uL (ref 0.0–0.1)
Immature Granulocytes: 0 %
Lymphocytes Absolute: 1.9 10*3/uL (ref 0.7–3.1)
Lymphs: 27 %
MCH: 29.6 pg (ref 26.6–33.0)
MCHC: 34.1 g/dL (ref 31.5–35.7)
MCV: 87 fL (ref 79–97)
Monocytes Absolute: 0.6 10*3/uL (ref 0.1–0.9)
Monocytes: 9 %
Neutrophils Absolute: 3.7 10*3/uL (ref 1.4–7.0)
Neutrophils: 54 %
Platelets: 270 10*3/uL (ref 150–450)
RBC: 4.43 x10E6/uL (ref 3.77–5.28)
RDW: 12.3 % (ref 11.7–15.4)
WBC: 6.9 10*3/uL (ref 3.4–10.8)

## 2021-07-05 LAB — DRUG SCREEN 10 W/CONF, SERUM
Amphetamines, IA: NEGATIVE ng/mL
Barbiturates, IA: NEGATIVE ug/mL
Benzodiazepines, IA: NEGATIVE ng/mL
Cocaine & Metabolite, IA: NEGATIVE ng/mL
Methadone, IA: NEGATIVE ng/mL
Opiates, IA: NEGATIVE ng/mL
Oxycodones, IA: NEGATIVE ng/mL
Phencyclidine, IA: NEGATIVE ng/mL
Propoxyphene, IA: NEGATIVE ng/mL
THC(Marijuana) Metabolite, IA: NEGATIVE ng/mL

## 2021-07-05 LAB — COMPREHENSIVE METABOLIC PANEL
ALT: 44 IU/L — ABNORMAL HIGH (ref 0–32)
AST: 36 IU/L (ref 0–40)
Albumin/Globulin Ratio: 1.2 (ref 1.2–2.2)
Albumin: 3.8 g/dL (ref 3.8–4.9)
Alkaline Phosphatase: 117 IU/L (ref 44–121)
BUN/Creatinine Ratio: 10 (ref 9–23)
BUN: 9 mg/dL (ref 6–24)
Bilirubin Total: 0.2 mg/dL (ref 0.0–1.2)
CO2: 22 mmol/L (ref 20–29)
Calcium: 9 mg/dL (ref 8.7–10.2)
Chloride: 102 mmol/L (ref 96–106)
Creatinine, Ser: 0.86 mg/dL (ref 0.57–1.00)
Globulin, Total: 3.3 g/dL (ref 1.5–4.5)
Glucose: 112 mg/dL — ABNORMAL HIGH (ref 70–99)
Potassium: 4.3 mmol/L (ref 3.5–5.2)
Sodium: 140 mmol/L (ref 134–144)
Total Protein: 7.1 g/dL (ref 6.0–8.5)
eGFR: 82 mL/min/{1.73_m2} (ref >59)

## 2021-07-05 LAB — LIPID PANEL
Chol/HDL Ratio: 3.1 ratio (ref 0.0–4.4)
Cholesterol, Total: 171 mg/dL (ref 100–199)
HDL: 56 mg/dL (ref >39)
LDL Chol Calc (NIH): 86 mg/dL (ref 0–99)
Triglycerides: 170 mg/dL — ABNORMAL HIGH (ref 0–149)
VLDL Cholesterol Cal: 29 mg/dL (ref 5–40)

## 2021-07-05 LAB — TSH: TSH: 77.6 u[IU]/mL — ABNORMAL HIGH (ref 0.450–4.500)

## 2021-07-11 ENCOUNTER — Ambulatory Visit: Payer: BC Managed Care – PPO | Admitting: Nurse Practitioner

## 2021-08-15 DIAGNOSIS — Z6837 Body mass index (BMI) 37.0-37.9, adult: Secondary | ICD-10-CM | POA: Diagnosis not present

## 2021-08-15 DIAGNOSIS — J40 Bronchitis, not specified as acute or chronic: Secondary | ICD-10-CM | POA: Diagnosis not present

## 2021-10-05 ENCOUNTER — Encounter: Payer: Self-pay | Admitting: Orthopaedic Surgery

## 2021-10-05 ENCOUNTER — Ambulatory Visit (INDEPENDENT_AMBULATORY_CARE_PROVIDER_SITE_OTHER): Payer: Self-pay | Admitting: Orthopaedic Surgery

## 2021-10-05 ENCOUNTER — Ambulatory Visit (INDEPENDENT_AMBULATORY_CARE_PROVIDER_SITE_OTHER): Payer: Self-pay

## 2021-10-05 VITALS — Ht 64.0 in | Wt 99.5 lb

## 2021-10-05 DIAGNOSIS — M25561 Pain in right knee: Secondary | ICD-10-CM

## 2021-10-05 DIAGNOSIS — G8929 Other chronic pain: Secondary | ICD-10-CM
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
## 2021-10-05 NOTE — Progress Notes (Signed)
Dg  

## 2021-10-17 ENCOUNTER — Other Ambulatory Visit: Payer: Self-pay | Admitting: Nurse Practitioner

## 2021-11-15 ENCOUNTER — Other Ambulatory Visit: Payer: Self-pay | Admitting: Nurse Practitioner

## 2021-11-15 DIAGNOSIS — F321 Major depressive disorder, single episode, moderate: Secondary | ICD-10-CM

## 2021-11-16 ENCOUNTER — Encounter: Payer: Self-pay | Admitting: Orthopaedic Surgery

## 2021-11-16 ENCOUNTER — Ambulatory Visit (INDEPENDENT_AMBULATORY_CARE_PROVIDER_SITE_OTHER): Payer: BC Managed Care – PPO | Admitting: Orthopaedic Surgery

## 2021-11-16 VITALS — Ht 64.0 in | Wt 226.0 lb

## 2021-11-16 DIAGNOSIS — M25561 Pain in right knee: Secondary | ICD-10-CM | POA: Diagnosis not present

## 2021-11-16 DIAGNOSIS — G8929 Other chronic pain: Secondary | ICD-10-CM | POA: Diagnosis not present

## 2021-11-16 MED ORDER — HYDROCODONE-ACETAMINOPHEN 5-325 MG PO TABS
ORAL_TABLET | ORAL | 0 refills | Status: DC
Start: 2021-11-16 — End: 2022-10-05

## 2021-11-16 NOTE — Progress Notes (Signed)
My knee is worse.  She has more pain and more swelling of the right knee and now giving way.  She has medial pain.  She is not improving.  She has more pain at the end of her 12 hour shifts.  Right knee has effusion, pain, crepitus, ROM 0 to 105, limp right, positive medial [place], NV intact, no distal edema.  Encounter Diagnosis  Name Primary?   Chronic pain of right knee Yes   I am concerned about medial meniscus tear.  I will get MRI.  She may need surgery.  She has not improved over the last six to eight weeks.  I have reviewed the [place] Controlled Substance Reporting System web site prior to prescribing narcotic medicine for this patient.  Return in two weeks.  Note given to be out of work if she needs to be.  Call if any problem.  Precautions discussed.  Electronically Signed Darreld Mclean, MD 6/7/202311:12 AM

## 2021-11-16 NOTE — Addendum Note (Signed)
Addended by: Derek Mound A on: 11/16/2021 11:15 AM   Modules accepted: Orders

## 2021-11-17 ENCOUNTER — Telehealth: Payer: Self-pay | Admitting: Orthopaedic Surgery

## 2021-11-17 ENCOUNTER — Ambulatory Visit (HOSPITAL_COMMUNITY)
Admission: RE | Admit: 2021-11-17 | Discharge: 2021-11-17 | Disposition: A | Discharge status: Home / Self Care | Payer: BC Managed Care – PPO | Source: Ambulatory Visit | Attending: Orthopaedic Surgery | Admitting: Orthopaedic Surgery

## 2021-11-17 DIAGNOSIS — M25561 Pain in right knee: Secondary | ICD-10-CM | POA: Diagnosis not present

## 2021-11-17 DIAGNOSIS — G8929 Other chronic pain: Secondary | ICD-10-CM | POA: Diagnosis not present

## 2021-11-17 NOTE — Telephone Encounter (Signed)
Per patient request, fax work note to employer to confidential fax to attention Clent Jacks, company nurse at [employer], fax#[phone] / patient aware it has been faxed.

## 2021-11-30 ENCOUNTER — Ambulatory Visit: Payer: BC Managed Care – PPO | Admitting: Orthopaedic Surgery

## 2021-12-20 ENCOUNTER — Telehealth: Payer: Self-pay | Admitting: Nurse Practitioner

## 2021-12-20 NOTE — Telephone Encounter (Signed)
  Prescription Request  12/20/2021  Is this a "Controlled Substance" medicine? no  Have you seen your PCP in the last 2 weeks? no  If YES, route message to pool  -  If NO, patient needs to be scheduled for appointment.  What is the name of the medication or equipment? Wegovy. Patient will need for her August injections.   Have you contacted your pharmacy to request a refill? no   Which pharmacy would you like this sent to? [facility] in [place]   Patient notified that their request is being sent to the clinical staff for review and that they should receive a response within 2 business days.

## 2021-12-21 NOTE — Telephone Encounter (Signed)
NA/VM full Wegovy not on current med list. In looking at reconciled med list, this may have been prescribed by [facility] provider. Need clarification.

## 2021-12-21 NOTE — Telephone Encounter (Signed)
Pt aware.

## 2021-12-23 ENCOUNTER — Ambulatory Visit: Payer: BC Managed Care – PPO | Admitting: Nurse Practitioner

## 2022-01-09 ENCOUNTER — Other Ambulatory Visit (HOSPITAL_BASED_OUTPATIENT_CLINIC_OR_DEPARTMENT_OTHER): Payer: Self-pay

## 2022-01-09 MED ORDER — WEGOVY 0.5 MG/0.5ML ~~LOC~~ SOAJ
SUBCUTANEOUS | 0 refills | Status: DC
  Filled 2022-01-09: qty 2, 28d supply, fill #0

## 2022-01-11 ENCOUNTER — Other Ambulatory Visit (HOSPITAL_BASED_OUTPATIENT_CLINIC_OR_DEPARTMENT_OTHER): Payer: Self-pay

## 2022-01-17 ENCOUNTER — Other Ambulatory Visit: Payer: Self-pay | Admitting: Nurse Practitioner

## 2022-01-17 DIAGNOSIS — F321 Major depressive disorder, single episode, moderate: Secondary | ICD-10-CM

## 2022-01-18 ENCOUNTER — Other Ambulatory Visit (HOSPITAL_BASED_OUTPATIENT_CLINIC_OR_DEPARTMENT_OTHER): Payer: Self-pay

## 2022-01-27 ENCOUNTER — Ambulatory Visit: Payer: BC Managed Care – PPO | Admitting: Nurse Practitioner

## 2022-01-30 ENCOUNTER — Ambulatory Visit: Payer: BC Managed Care – PPO | Admitting: Nurse Practitioner

## 2022-01-31 ENCOUNTER — Encounter: Payer: Self-pay | Admitting: Nurse Practitioner

## 2022-02-01 ENCOUNTER — Telehealth: Payer: Self-pay

## 2022-02-01 NOTE — Chronic Care Management (AMB) (Signed)
  Care Management   Outreach Note  02/01/2022 Name: Rebecca Pham MRN: [id number] DOB: 10/12/1970  An unsuccessful telephone outreach was attempted today. The patient was referred to the case management team for assistance with care management and care coordination.   Follow Up Plan:  The care management team will reach out to the patient again over the next 7 days.  If patient returns call to provider office, please advise to call Embedded Care Management Care Guide Penne Lash * at [phone]*  Penne Lash, RMA Care Guide [facility]  [place], [place] [address] Direct Dial: [phone] [email or web address]

## 2022-02-02 ENCOUNTER — Other Ambulatory Visit (HOSPITAL_BASED_OUTPATIENT_CLINIC_OR_DEPARTMENT_OTHER): Payer: Self-pay

## 2022-02-02 ENCOUNTER — Other Ambulatory Visit: Payer: Self-pay | Admitting: Nurse Practitioner

## 2022-02-02 DIAGNOSIS — L239 Allergic contact dermatitis, unspecified cause: Secondary | ICD-10-CM

## 2022-02-02 NOTE — Chronic Care Management (AMB) (Signed)
  Care Management   Outreach Note  02/02/2022 Name: Rebecca Pham MRN: [id number] DOB: Jan 05, 1971  A second unsuccessful telephone outreach was attempted today. The patient was referred to the case management team for assistance with care management and care coordination.   Follow Up Plan:  The care management team will reach out to the patient again over the next 7 days.  If patient returns call to provider office, please advise to call  Care Management Care Guide Penne Lash * at [phone]*  Penne Lash, RMA Care Guide [facility]  [place], [place] [address] Direct Dial: [phone] [name]

## 2022-02-14 NOTE — Chronic Care Management (AMB) (Signed)
  Care Coordination  Outreach Note  02/14/2022 Name: Rebecca Pham MRN: [id number] DOB: [birth year]   Care Coordination Outreach Attempts  A third unsuccessful outreach was attempted today to offer the patient with information about available care coordination services as a benefit of their health plan.   Follow Up Plan:  No further outreach attempts will be made at this time. We have been unable to contact the patient to offer or enroll patient in care coordination services  Encounter Outcome:  No Answer  Sig Penne Lash, RMA Care Guide [facility]  [place], [place] [address] Direct Dial: [phone] [email or web address]

## 2022-02-19 ENCOUNTER — Other Ambulatory Visit: Payer: Self-pay | Admitting: Nurse Practitioner

## 2022-02-19 DIAGNOSIS — L239 Allergic contact dermatitis, unspecified cause: Secondary | ICD-10-CM

## 2022-02-19 DIAGNOSIS — F321 Major depressive disorder, single episode, moderate: Secondary | ICD-10-CM

## 2022-02-20 NOTE — Telephone Encounter (Signed)
Tried calling pt to schedule a med refill appt but NA/VM full.

## 2022-02-27 ENCOUNTER — Encounter: Payer: Self-pay | Admitting: Nurse Practitioner

## 2022-02-27 ENCOUNTER — Ambulatory Visit (INDEPENDENT_AMBULATORY_CARE_PROVIDER_SITE_OTHER): Payer: BC Managed Care – PPO | Admitting: Nurse Practitioner

## 2022-02-27 VITALS — BP 140/96 | HR 80 | Temp 98.6°F | Ht 64.0 in | Wt 211.0 lb

## 2022-02-27 DIAGNOSIS — K219 Gastro-esophageal reflux disease without esophagitis: Secondary | ICD-10-CM | POA: Diagnosis not present

## 2022-02-27 DIAGNOSIS — Z23 Encounter for immunization: Secondary | ICD-10-CM

## 2022-02-27 DIAGNOSIS — F411 Generalized anxiety disorder: Secondary | ICD-10-CM | POA: Diagnosis not present

## 2022-02-27 DIAGNOSIS — F321 Major depressive disorder, single episode, moderate: Secondary | ICD-10-CM

## 2022-02-27 DIAGNOSIS — E039 Hypothyroidism, unspecified: Secondary | ICD-10-CM

## 2022-02-27 MED ORDER — LEVOTHYROXINE SODIUM 88 MCG PO TABS: 88.0000 ug | ORAL_TABLET | Freq: Every day | ORAL | 3 refills | Status: DC

## 2022-02-27 MED ORDER — CITALOPRAM HYDROBROMIDE 20 MG PO TABS: 10.0000 mg | ORAL_TABLET | Freq: Every day | ORAL | 0 refills | Status: DC

## 2022-02-27 MED ORDER — PANTOPRAZOLE SODIUM 40 MG PO TBEC: 40.0000 mg | DELAYED_RELEASE_TABLET | Freq: Every day | ORAL | 0 refills | Status: DC

## 2022-02-27 MED ORDER — ALPRAZOLAM 0.25 MG PO TABS: 0.2500 mg | ORAL_TABLET | Freq: Two times a day (BID) | ORAL | 0 refills | Status: DC | PRN

## 2022-02-27 NOTE — Assessment & Plan Note (Signed)
Patient has no worsening symptoms, labs completed results pending.

## 2022-02-27 NOTE — Assessment & Plan Note (Signed)
GAD-7 completed, anxiety symptoms well controlled on current medication no changes necessary.  Rx refill sent to pharmacy.

## 2022-02-27 NOTE — Assessment & Plan Note (Addendum)
Labs completed results pending.  

## 2022-02-27 NOTE — Progress Notes (Signed)
Established Patient Office Visit  Subjective   Patient ID: Rebecca Pham, female    DOB: [birth year]  Age: 51 y.o. MRN: [id number]  Chief Complaint  Patient presents with   Medication Refill    HPI  Thyroid: Patient presents for evaluation of hypothyroidism. Current symptoms include fatigue, weight gain. Patient denies anxiousness, feeling excessive energy, tremulousness.     GERD, Follow up:  The patient was last seen for GERD few years ago. Changes made since that visit include Protonix 40 mg tablet by mouth daily..  She reports good compliance with treatment. She is not having side effects. .  She IS experiencing occasional reflux.. She is NOT experiencing difficulty swallowing, dysphagia, fullness after meals, or hoarseness  -----------------------------------------------------------------------------------------  Anxiety, Follow-up  She was last seen for anxiety 1 year ago. Changes made at last visit include Xanax 0.25 mg tablet by mouth.   She reports good compliance with treatment. She reports good tolerance of treatment. She is not having side effects.   She feels her anxiety is mild and Improved since last visit.  Symptoms: No chest pain No difficulty concentrating  No dizziness No fatigue  No feelings of losing control No insomnia  Yes irritable No palpitations  No panic attacks No racing thoughts  No shortness of breath No sweating  No tremors/shakes    GAD-7 Results    02/27/2022   11:48 AM 06/24/2021   11:02 AM 12/10/2020    9:27 AM  GAD-7 Generalized Anxiety Disorder Screening Tool  1. Feeling Nervous, Anxious, or on Edge 1 1 0  2. Not Being Able to Stop or Control Worrying _0 3. Worrying Too Much About Different Things _1 4. Trouble Relaxing 0 3 0  5. Being So Restless it's Hard To Sit Still 0 1 0  6. Becoming Easily Annoyed or Irritable _2 7. Feeling Afraid As If Something Awful Might Happen 0 0 0  Total GAD-7 Score _3 Difficulty At Work, Home, or Getting  Along With Others? Somewhat difficult Somewhat difficult Not difficult at all    PHQ-9 Scores    02/27/2022   11:48 AM 06/24/2021   11:02 AM 12/10/2020    9:20 AM  PHQ9 SCORE ONLY  PHQ-9 Total Score _4 ---------------------------------------------------------------------------------------------------  Patient Active Problem List   Diagnosis Date Noted   Sleep apnea 12/10/2020   Elevated cholesterol 12/10/2020   Hospital discharge follow-up 10/20/2020   Low hemoglobin 10/20/2020   Low iron 10/11/2020   Fatigue 10/11/2020   Chronic bilateral low back pain with bilateral sciatica 09/06/2018   Lateral epicondylitis, left elbow 08/23/2018   Hypothyroid 01/02/2018   Stress incontinence 01/01/2018   Abnormal mammogram 01/01/2018   ASCUS with positive high risk HPV cervical 03/05/2017   Gastroesophageal reflux disease without esophagitis 01/31/2017   Mild intermittent asthma without complication 54/62/7035   Depression, major, single episode, moderate ([place]) 01/31/2017   GAD (generalized anxiety disorder) 01/31/2017   Allergic dermatitis 01/31/2017   Hives 01/31/2017   Past Medical History:  Diagnosis Date   Asthma    GERD (gastroesophageal reflux disease)    Past Surgical History:  Procedure Laterality Date   KNEE SURGERY  1997   TUBAL LIGATION     Social History   Tobacco Use   Smoking status: Never   Smokeless tobacco: Never  Vaping Use   Vaping Use: Never used  Substance Use Topics   Alcohol  use: Yes    Comment: occ   Drug use: No   Social History   Socioeconomic History   Marital status: Married    Spouse name: Not on file   Number of children: Not on file   Years of education: Not on file   Highest education level: Not on file  Occupational History   Not on file  Tobacco Use   Smoking status: Never   Smokeless tobacco: Never  Vaping Use   Vaping Use: Never used  Substance and Sexual Activity   Alcohol  use: Yes    Comment: occ   Drug use: No   Sexual activity: Yes    Birth control/protection: Surgical  Other Topics Concern   Not on file  Social History Narrative   Not on file   Social Determinants of Health   Financial Resource Strain: Not on file  Food Insecurity: Not on file  Transportation Needs: Not on file  Physical Activity: Not on file  Stress: Not on file  Social Connections: Not on file  Intimate Partner Violence: Not on file   Family Status  Relation Name Status   Mother  Alive   Father  Alive   MGM  Deceased   PGF  (Not Specified)   Family History  Problem Relation Age of Onset   COPD Father    Arthritis Father    Cancer Maternal Grandmother        lung   Vision loss Paternal Grandfather    Diabetes Paternal Grandfather    Heart disease Paternal Grandfather    Allergies  Allergen Reactions   Asa [Aspirin] Swelling      Review of Systems  Constitutional: Negative.   HENT: Negative.    Respiratory: Negative.    Cardiovascular: Negative.   Gastrointestinal: Negative.   Genitourinary: Negative.   Musculoskeletal: Negative.   Skin: Negative.  Negative for itching and rash.  Neurological: Negative.   All other systems reviewed and are negative.     Objective:     BP (!) 140/96   Pulse 80   Temp 98.6 F (37 C)   Ht $R'5\' 4"'RZ$  (1.626 m)   Wt 211 lb (95.7 kg)   LMP 06/25/2019 (Approximate) Comment: partial  SpO2 97%   BMI 36.22 kg/m  BP Readings from Last 3 Encounters:  02/27/22 (!) 140/96  06/24/21 133/81  12/10/20 133/85   Wt Readings from Last 3 Encounters:  02/27/22 211 lb (95.7 kg)  11/16/21 226 lb (102.5 kg)  10/05/21 99 lb 8 oz (45.1 kg)      Physical Exam Vitals and nursing note reviewed.  Constitutional:      Appearance: Normal appearance.  HENT:     Head: Normocephalic.     Right Ear: External ear normal.     Left Ear: External ear normal.     Nose: Nose normal.  Eyes:     Conjunctiva/sclera: Conjunctivae normal.   Cardiovascular:     Rate and Rhythm: Regular rhythm.     Pulses: Normal pulses.     Heart sounds: Normal heart sounds.  Pulmonary:     Effort: Pulmonary effort is normal.     Breath sounds: Normal breath sounds.  Abdominal:     General: Bowel sounds are normal.  Skin:    General: Skin is warm.     Findings: No erythema or rash.  Neurological:     General: No focal deficit present.     Mental Status: She is alert and oriented to  person, place, and time.      No results found for any visits on 02/27/22.  Last CBC Lab Results  Component Value Date   WBC 6.9 06/24/2021   HGB 13.1 06/24/2021   HCT 38.4 06/24/2021   MCV 87 06/24/2021   MCH 29.6 06/24/2021   RDW 12.3 06/24/2021   PLT 270 62/44/6950   Last metabolic panel Lab Results  Component Value Date   GLUCOSE 112 (H) 06/24/2021   NA 140 06/24/2021   K 4.3 06/24/2021   CL 102 06/24/2021   CO2 22 06/24/2021   BUN 9 06/24/2021   CREATININE 0.86 06/24/2021   EGFR 82 06/24/2021   CALCIUM 9.0 06/24/2021   PROT 7.1 06/24/2021   ALBUMIN 3.8 06/24/2021   LABGLOB 3.3 06/24/2021   AGRATIO 1.2 06/24/2021   BILITOT 0.2 06/24/2021   ALKPHOS 117 06/24/2021   AST 36 06/24/2021   ALT 44 (H) 06/24/2021   Last lipids Lab Results  Component Value Date   CHOL 171 06/24/2021   HDL 56 06/24/2021   LDLCALC 86 06/24/2021   TRIG 170 (H) 06/24/2021   CHOLHDL 3.1 06/24/2021   Last hemoglobin A1c Lab Results  Component Value Date   HGBA1C 5.7 01/29/2019   Last thyroid functions Lab Results  Component Value Date   TSH 77.600 (H) 06/24/2021   T4TOTAL 7.3 08/05/2019   Last vitamin D No results found for: "25OHVITD2", "25OHVITD3", "VD25OH" Last vitamin B12 and Folate Lab Results  Component Value Date   VITAMINB12 213 (L) 12/10/2020   FOLATE 4.1 12/10/2020      The 10-year ASCVD risk score (Arnett DK, et al., 2019) is: 1.4%    Assessment & Plan:   Problem List Items Addressed This Visit       Digestive    Gastroesophageal reflux disease without esophagitis - Primary     Labs completed results pending.      Relevant Medications   pantoprazole (PROTONIX) 40 MG tablet   Other Relevant Orders   CBC with Differential   CMP14+EGFR   Lipid Panel     Endocrine   Hypothyroid    Patient has no worsening symptoms, labs completed results pending.      Relevant Medications   levothyroxine (SYNTHROID) 88 MCG tablet   Other Relevant Orders   TSH     Other   Depression, major, single episode, moderate (HCC)   Relevant Medications   ALPRAZolam (XANAX) 0.25 MG tablet   citalopram (CELEXA) 20 MG tablet   GAD (generalized anxiety disorder)    GAD-7 completed, anxiety symptoms well controlled on current medication no changes necessary.  Rx refill sent to pharmacy.      Relevant Medications   ALPRAZolam (XANAX) 0.25 MG tablet   citalopram (CELEXA) 20 MG tablet    Return in about 6 months (around 08/28/2022).    Ivy Lynn, NP

## 2022-02-27 NOTE — Patient Instructions (Signed)

## 2022-02-28 ENCOUNTER — Other Ambulatory Visit: Payer: BC Managed Care – PPO

## 2022-02-28 DIAGNOSIS — E039 Hypothyroidism, unspecified: Secondary | ICD-10-CM | POA: Diagnosis not present

## 2022-02-28 DIAGNOSIS — K219 Gastro-esophageal reflux disease without esophagitis: Secondary | ICD-10-CM

## 2022-03-01 LAB — CBC WITH DIFFERENTIAL/PLATELET
Basophils Absolute: 0.1 10*3/uL (ref 0.0–0.2)
Basos: 1 %
EOS (ABSOLUTE): 0.7 10*3/uL — ABNORMAL HIGH (ref 0.0–0.4)
Eos: 9 %
Hematocrit: 39.2 % (ref 34.0–46.6)
Hemoglobin: 13.2 g/dL (ref 11.1–15.9)
Immature Grans (Abs): 0 10*3/uL (ref 0.0–0.1)
Immature Granulocytes: 0 %
Lymphocytes Absolute: 2.2 10*3/uL (ref 0.7–3.1)
Lymphs: 29 %
MCH: 28.7 pg (ref 26.6–33.0)
MCHC: 33.7 g/dL (ref 31.5–35.7)
MCV: 85 fL (ref 79–97)
Monocytes Absolute: 0.6 10*3/uL (ref 0.1–0.9)
Monocytes: 8 %
Neutrophils Absolute: 3.9 10*3/uL (ref 1.4–7.0)
Neutrophils: 53 %
Platelets: 278 10*3/uL (ref 150–450)
RBC: 4.6 x10E6/uL (ref 3.77–5.28)
RDW: 14.3 % (ref 11.7–15.4)
WBC: 7.5 10*3/uL (ref 3.4–10.8)

## 2022-03-01 LAB — LIPID PANEL
Chol/HDL Ratio: 3.3 ratio (ref 0.0–4.4)
Cholesterol, Total: 171 mg/dL (ref 100–199)
HDL: 52 mg/dL (ref >39)
LDL Chol Calc (NIH): 97 mg/dL (ref 0–99)
Triglycerides: 123 mg/dL (ref 0–149)
VLDL Cholesterol Cal: 22 mg/dL (ref 5–40)

## 2022-03-01 LAB — CMP14+EGFR
ALT: 43 IU/L — ABNORMAL HIGH (ref 0–32)
AST: 28 IU/L (ref 0–40)
Albumin/Globulin Ratio: 1.4 (ref 1.2–2.2)
Albumin: 4.1 g/dL (ref 3.8–4.9)
Alkaline Phosphatase: 121 IU/L (ref 44–121)
BUN/Creatinine Ratio: 12 (ref 9–23)
BUN: 10 mg/dL (ref 6–24)
Bilirubin Total: 0.4 mg/dL (ref 0.0–1.2)
CO2: 23 mmol/L (ref 20–29)
Calcium: 9.1 mg/dL (ref 8.7–10.2)
Chloride: 103 mmol/L (ref 96–106)
Creatinine, Ser: 0.85 mg/dL (ref 0.57–1.00)
Globulin, Total: 2.9 g/dL (ref 1.5–4.5)
Glucose: 104 mg/dL — ABNORMAL HIGH (ref 70–99)
Potassium: 4.2 mmol/L (ref 3.5–5.2)
Sodium: 143 mmol/L (ref 134–144)
Total Protein: 7 g/dL (ref 6.0–8.5)
eGFR: 82 mL/min/{1.73_m2} (ref >59)

## 2022-03-01 LAB — TSH: TSH: 0.444 u[IU]/mL — ABNORMAL LOW (ref 0.450–4.500)

## 2022-03-29 ENCOUNTER — Other Ambulatory Visit: Payer: Self-pay | Admitting: Nurse Practitioner

## 2022-03-29 DIAGNOSIS — F321 Major depressive disorder, single episode, moderate: Secondary | ICD-10-CM

## 2022-04-04 ENCOUNTER — Other Ambulatory Visit: Payer: Self-pay | Admitting: Nurse Practitioner

## 2022-04-04 DIAGNOSIS — K219 Gastro-esophageal reflux disease without esophagitis: Secondary | ICD-10-CM

## 2022-06-07 ENCOUNTER — Other Ambulatory Visit: Payer: Self-pay | Admitting: Nurse Practitioner

## 2022-06-07 DIAGNOSIS — E7800 Pure hypercholesterolemia, unspecified: Secondary | ICD-10-CM

## 2022-07-18 ENCOUNTER — Encounter: Payer: Self-pay | Admitting: Nurse Practitioner

## 2022-09-21 ENCOUNTER — Ambulatory Visit: Payer: BC Managed Care – PPO | Admitting: Nurse Practitioner

## 2022-09-22 ENCOUNTER — Encounter: Payer: Self-pay | Admitting: Nurse Practitioner

## 2022-09-28 ENCOUNTER — Other Ambulatory Visit: Payer: Self-pay | Admitting: *Deleted

## 2022-09-28 DIAGNOSIS — E7800 Pure hypercholesterolemia, unspecified: Secondary | ICD-10-CM

## 2022-09-28 DIAGNOSIS — F321 Major depressive disorder, single episode, moderate: Secondary | ICD-10-CM

## 2022-09-28 DIAGNOSIS — K219 Gastro-esophageal reflux disease without esophagitis: Secondary | ICD-10-CM

## 2022-09-28 MED ORDER — ATORVASTATIN CALCIUM 10 MG PO TABS: 10.0000 mg | ORAL_TABLET | Freq: Every day | ORAL | 0 refills | Status: DC

## 2022-09-28 MED ORDER — PANTOPRAZOLE SODIUM 40 MG PO TBEC: 40.0000 mg | DELAYED_RELEASE_TABLET | Freq: Every day | ORAL | 0 refills | Status: DC

## 2022-09-28 MED ORDER — CITALOPRAM HYDROBROMIDE 20 MG PO TABS: 10.0000 mg | ORAL_TABLET | Freq: Every day | ORAL | 0 refills | Status: DC

## 2022-10-05 ENCOUNTER — Encounter: Payer: Self-pay | Admitting: Family Medicine

## 2022-10-05 ENCOUNTER — Ambulatory Visit (INDEPENDENT_AMBULATORY_CARE_PROVIDER_SITE_OTHER): Payer: BC Managed Care – PPO | Admitting: Family Medicine

## 2022-10-05 VITALS — BP 129/86 | HR 83 | Temp 99.0°F | Ht 64.0 in | Wt 220.0 lb

## 2022-10-05 DIAGNOSIS — K219 Gastro-esophageal reflux disease without esophagitis: Secondary | ICD-10-CM | POA: Diagnosis not present

## 2022-10-05 DIAGNOSIS — F321 Major depressive disorder, single episode, moderate: Secondary | ICD-10-CM | POA: Diagnosis not present

## 2022-10-05 DIAGNOSIS — F411 Generalized anxiety disorder: Secondary | ICD-10-CM

## 2022-10-05 DIAGNOSIS — H6993 Unspecified Eustachian tube disorder, bilateral: Secondary | ICD-10-CM

## 2022-10-05 DIAGNOSIS — E611 Iron deficiency: Secondary | ICD-10-CM

## 2022-10-05 DIAGNOSIS — E039 Hypothyroidism, unspecified: Secondary | ICD-10-CM

## 2022-10-05 DIAGNOSIS — R7303 Prediabetes: Secondary | ICD-10-CM

## 2022-10-05 DIAGNOSIS — E785 Hyperlipidemia, unspecified: Secondary | ICD-10-CM

## 2022-10-05 MED ORDER — HYDROXYZINE PAMOATE 25 MG PO CAPS: 25.0000 mg | ORAL_CAPSULE | Freq: Three times a day (TID) | ORAL | 0 refills | Status: AC | PRN

## 2022-10-05 MED ORDER — LEVOCETIRIZINE DIHYDROCHLORIDE 5 MG PO TABS: 5.0000 mg | ORAL_TABLET | Freq: Every evening | ORAL | 0 refills | Status: DC
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
## 2022-10-24 ENCOUNTER — Telehealth: Payer: Self-pay | Admitting: Nurse Practitioner

## 2022-10-24 ENCOUNTER — Other Ambulatory Visit (HOSPITAL_COMMUNITY): Payer: Self-pay

## 2022-10-24 DIAGNOSIS — J4520 Mild intermittent asthma, uncomplicated: Secondary | ICD-10-CM

## 2022-10-24 NOTE — Telephone Encounter (Signed)
Ran test claim, insurance covers brand name (daw [id number]). Called pharmacy to run prescription but prescription expired in January.

## 2022-10-24 NOTE — Telephone Encounter (Signed)
Pt called stating that per pharmacy, her Inhaler (Symbicort) is requiring PA. Pt needs this completed asap.

## 2022-10-25 MED ORDER — BUDESONIDE-FORMOTEROL FUMARATE 80-4.5 MCG/ACT IN AERO
2.0000 | INHALATION_SPRAY | Freq: Two times a day (BID) | RESPIRATORY_TRACT | 5 refills | Status: DC
Start: 2022-10-25 — End: 2022-10-27

## 2022-10-25 NOTE — Telephone Encounter (Signed)
Contacted patient. Notified patient. Patient verbalized understanding 

## 2022-10-27 ENCOUNTER — Other Ambulatory Visit: Payer: Self-pay | Admitting: Family Medicine

## 2022-10-27 ENCOUNTER — Telehealth: Payer: Self-pay | Admitting: Family Medicine

## 2022-10-27 DIAGNOSIS — J4520 Mild intermittent asthma, uncomplicated: Secondary | ICD-10-CM

## 2022-10-27 NOTE — Telephone Encounter (Signed)
Patient calling because the inhaler that was called in, is too expensive. She wants to know if something else can be called in.

## 2022-10-27 NOTE — Telephone Encounter (Signed)
  SYMBICORT 80-4.5 MCG/ACT inhaler    Pharmacy comment: Alternative Requested:MEDICINE IS TOO EXPENSIVE. PLEASE SEND IN ALTERNATIVE. BRAND AND GENERIC BOTH COST OVER $200 THANKS~!

## 2022-10-27 NOTE — Telephone Encounter (Signed)
Contacted pharmacy $198.00 w/ insurance

## 2022-10-30 MED ORDER — BUDESONIDE-FORMOTEROL FUMARATE 80-4.5 MCG/ACT IN AERO: INHALATION_SPRAY | RESPIRATORY_TRACT | 5 refills | Status: DC

## 2022-10-30 NOTE — Telephone Encounter (Signed)
Refill failed. resent °

## 2022-10-31 MED ORDER — BREZTRI AEROSPHERE 160-9-4.8 MCG/ACT IN AERO: 2.0000 | INHALATION_SPRAY | Freq: Two times a day (BID) | RESPIRATORY_TRACT | 11 refills | Status: AC

## 2022-10-31 NOTE — Telephone Encounter (Signed)
CALLED PATIENT, NO ANSWER °

## 2022-10-31 NOTE — Telephone Encounter (Signed)
Pt r/c.

## 2022-10-31 NOTE — Addendum Note (Signed)
Addended by: Neale Burly on: 10/31/2022 01:13 PM   Modules accepted: Orders

## 2022-11-16 ENCOUNTER — Other Ambulatory Visit: Payer: BC Managed Care – PPO

## 2022-11-16 DIAGNOSIS — E611 Iron deficiency: Secondary | ICD-10-CM | POA: Diagnosis not present

## 2022-11-16 DIAGNOSIS — E785 Hyperlipidemia, unspecified: Secondary | ICD-10-CM

## 2022-11-16 DIAGNOSIS — E039 Hypothyroidism, unspecified: Secondary | ICD-10-CM

## 2022-11-16 DIAGNOSIS — R6889 Other general symptoms and signs: Secondary | ICD-10-CM | POA: Diagnosis not present

## 2022-11-16 DIAGNOSIS — R7303 Prediabetes: Secondary | ICD-10-CM | POA: Diagnosis not present

## 2022-11-16 DIAGNOSIS — S61213A Laceration without foreign body of left middle finger without damage to nail, initial encounter: Secondary | ICD-10-CM | POA: Diagnosis not present

## 2022-11-16 DIAGNOSIS — Z23 Encounter for immunization: Secondary | ICD-10-CM | POA: Diagnosis not present

## 2022-11-16 LAB — BAYER DCA HB A1C WAIVED: HB A1C (BAYER DCA - WAIVED): 5.8 % — ABNORMAL HIGH (ref 4.8–5.6)

## 2022-11-17 LAB — ANEMIA PROFILE B
Basophils Absolute: 0.1 10*3/uL (ref 0.0–0.2)
Basos: 1 %
EOS (ABSOLUTE): 0.6 10*3/uL — ABNORMAL HIGH (ref 0.0–0.4)
Eos: 10 %
Ferritin: 41 ng/mL (ref 15–150)
Folate: 7.2 ng/mL (ref >3.0)
Hematocrit: 40.2 % (ref 34.0–46.6)
Hemoglobin: 13.3 g/dL (ref 11.1–15.9)
Immature Grans (Abs): 0 10*3/uL (ref 0.0–0.1)
Immature Granulocytes: 0 %
Iron Saturation: 27 % (ref 15–55)
Iron: 106 ug/dL (ref 27–159)
Lymphocytes Absolute: 1.9 10*3/uL (ref 0.7–3.1)
Lymphs: 31 %
MCH: 29.6 pg (ref 26.6–33.0)
MCHC: 33.1 g/dL (ref 31.5–35.7)
MCV: 89 fL (ref 79–97)
Monocytes Absolute: 0.5 10*3/uL (ref 0.1–0.9)
Monocytes: 8 %
Neutrophils Absolute: 3.1 10*3/uL (ref 1.4–7.0)
Neutrophils: 50 %
Platelets: 283 10*3/uL (ref 150–450)
RBC: 4.5 x10E6/uL (ref 3.77–5.28)
RDW: 12.8 % (ref 11.7–15.4)
Retic Ct Pct: 1.9 % (ref 0.6–2.6)
Total Iron Binding Capacity: 393 ug/dL (ref 250–450)
UIBC: 287 ug/dL (ref 131–425)
Vitamin B-12: 924 pg/mL (ref 232–1245)
WBC: 6.1 10*3/uL (ref 3.4–10.8)

## 2022-11-17 LAB — THYROID PANEL WITH TSH
Free Thyroxine Index: 1.1 — ABNORMAL LOW (ref 1.2–4.9)
T3 Uptake Ratio: 23 % — ABNORMAL LOW (ref 24–39)
T4, Total: 4.8 ug/dL (ref 4.5–12.0)
TSH: 24.8 u[IU]/mL — ABNORMAL HIGH (ref 0.450–4.500)

## 2022-11-17 LAB — CMP14+EGFR
ALT: 28 IU/L (ref 0–32)
AST: 19 IU/L (ref 0–40)
Albumin/Globulin Ratio: 1.5 (ref 1.2–2.2)
Albumin: 4.1 g/dL (ref 3.8–4.9)
Alkaline Phosphatase: 112 IU/L (ref 44–121)
BUN/Creatinine Ratio: 10 (ref 9–23)
BUN: 8 mg/dL (ref 6–24)
Bilirubin Total: 0.4 mg/dL (ref 0.0–1.2)
CO2: 23 mmol/L (ref 20–29)
Calcium: 8.7 mg/dL (ref 8.7–10.2)
Chloride: 100 mmol/L (ref 96–106)
Creatinine, Ser: 0.81 mg/dL (ref 0.57–1.00)
Globulin, Total: 2.8 g/dL (ref 1.5–4.5)
Glucose: 111 mg/dL — ABNORMAL HIGH (ref 70–99)
Potassium: 4.2 mmol/L (ref 3.5–5.2)
Sodium: 138 mmol/L (ref 134–144)
Total Protein: 6.9 g/dL (ref 6.0–8.5)
eGFR: 87 mL/min/{1.73_m2} (ref >59)

## 2022-11-17 LAB — LIPID PANEL
Chol/HDL Ratio: 3.2 ratio (ref 0.0–4.4)
Cholesterol, Total: 165 mg/dL (ref 100–199)
HDL: 51 mg/dL (ref >39)
LDL Chol Calc (NIH): 89 mg/dL (ref 0–99)
Triglycerides: 143 mg/dL (ref 0–149)
VLDL Cholesterol Cal: 25 mg/dL (ref 5–40)

## 2022-11-17 MED ORDER — SYNTHROID 100 MCG PO TABS: 100.0000 ug | ORAL_TABLET | Freq: Every day | ORAL | 0 refills | Status: AC

## 2022-11-17 NOTE — Progress Notes (Signed)
Increase dose of levothyroxine to 100 mcg. Make sure that patient is taking it appropriately in am 2 hours before food or other medications. Will need to recheck in 8 weeks. Please schedule lab appt. Patient is now in prediabetes range. Recommendhealthy lifestyle choices, including diet rich in fruits, vegetables, and lean proteins, and low in salt and simple carbohydrates and exercise (at least 30 minutes of moderate physical activity daily). Limit beverages high is sugar. Recommended at least 80-100 oz of water daily. Will recheck A1c in 3-6 months.

## 2022-11-17 NOTE — Addendum Note (Signed)
Addended by: Neale Burly on: 11/17/2022 09:00 AM   Modules accepted: Orders

## 2022-11-21 ENCOUNTER — Ambulatory Visit: Payer: BC Managed Care – PPO | Admitting: Family Medicine

## 2022-11-21 ENCOUNTER — Encounter: Payer: Self-pay | Admitting: Family Medicine

## 2022-11-21 VITALS — BP 117/79 | HR 83 | Temp 98.9°F | Ht 64.0 in | Wt 222.0 lb

## 2022-11-21 DIAGNOSIS — K219 Gastro-esophageal reflux disease without esophagitis: Secondary | ICD-10-CM

## 2022-11-21 DIAGNOSIS — E039 Hypothyroidism, unspecified: Secondary | ICD-10-CM

## 2022-11-21 DIAGNOSIS — F411 Generalized anxiety disorder: Secondary | ICD-10-CM | POA: Diagnosis not present

## 2022-11-21 DIAGNOSIS — R0683 Snoring: Secondary | ICD-10-CM

## 2022-11-21 DIAGNOSIS — S61313A Laceration without foreign body of left middle finger with damage to nail, initial encounter: Secondary | ICD-10-CM

## 2022-11-21 DIAGNOSIS — F321 Major depressive disorder, single episode, moderate: Secondary | ICD-10-CM

## 2022-11-21 DIAGNOSIS — S61313D Laceration without foreign body of left middle finger with damage to nail, subsequent encounter: Secondary | ICD-10-CM

## 2022-11-21 NOTE — Progress Notes (Signed)
Acute Office Visit  Subjective:  Patient ID: Rebecca Pham, female    DOB: Nov 07, 1970, 52 y.o.   MRN: [id number]  Chief Complaint  Patient presents with   Medical Management of Chronic Issues    GERD, thyroid, Depression, f/u   HPI Patient is in today for follow up of GERD, thyroid and Depression   GERD, Follow up: She reports excellent compliance with treatment. She is not having side effects.  She is NOT experiencing choking on food, cough, difficulty swallowing, dysphagia, hematemesis, or melena  Laceration of Finger  States that she was trying to cut wax out of a candle holder and cut her finger. States that she needs FMLA filled out. States that she went to [facility] and was instructed not to lift anything for 7 days.   Anxiety/Depression/Panic attacks  States that she recently found out more bad news about a family member that has cancer. States that her husband is not taking it well. Reports that when he is stressed, he stresses her out. Denies SI.   Thyroid: Patient presents for evaluation of hypothyroidism. Current symptoms include depression. Patient denies fatigue, weight gain, feeling cold and cold intolerance, constipation, swelling, anxiousness, feeling excessive energy, tremulousness, palpitations, sweating, weight loss, diarrhea, change in skin,  nails, or hair, heat intolerance, goiter, ocular symptoms, amenorrhea, menorrhagia.   States that she is trying a diet through her work. She was on Wegovy but she started having GI Upset so she stopped taking it, but she is going back this month.   States that she is coordinating with [facility] to get her Shingles shot.   ROS As per HPI    Objective:  BP 117/79   Pulse 83   Temp 98.9 F (37.2 C)   Ht 5\' 4"  (1.626 m)   Wt 222 lb (100.7 kg)   LMP 06/25/2019 (Approximate) Comment: partial  SpO2 96%   BMI 38.11 kg/m   Physical Exam Constitutional:      General: She is awake. She is not in acute distress.     Appearance: Normal appearance. She is well-developed and well-groomed. She is obese. She is not ill-appearing, toxic-appearing or diaphoretic.  Neck:     Thyroid: Thyromegaly present.  Cardiovascular:     Rate and Rhythm: Normal rate.     Pulses: Normal pulses.          Radial pulses are 2+ on the right side and 2+ on the left side.       Posterior tibial pulses are 2+ on the right side and 2+ on the left side.     Heart sounds: Normal heart sounds. No murmur heard.    No gallop.  Pulmonary:     Effort: Pulmonary effort is normal. No respiratory distress.     Breath sounds: Normal breath sounds. No stridor. No wheezing, rhonchi or rales.  Musculoskeletal:     Cervical back: Full passive range of motion without pain and neck supple.     Right lower leg: No edema.     Left lower leg: No edema.  Skin:    General: Skin is warm.     Capillary Refill: Capillary refill takes less than 2 seconds.  Neurological:     General: No focal deficit present.     Mental Status: She is alert, oriented to person, place, and time and easily aroused. Mental status is at baseline.     GCS: GCS eye subscore is 4. GCS verbal subscore is 5. GCS motor subscore is  6.     Motor: No weakness.  Psychiatric:        Attention and Perception: Attention and perception normal.        Mood and Affect: Mood and affect normal.        Speech: Speech normal.        Behavior: Behavior normal. Behavior is cooperative.        Thought Content: Thought content normal. Thought content does not include homicidal or suicidal ideation. Thought content does not include homicidal or suicidal plan.        Cognition and Memory: Cognition and memory normal.        Judgment: Judgment normal.        11/21/2022   10:12 AM 10/05/2022   11:23 AM 02/27/2022   11:48 AM  Depression screen PHQ 2/9  Decreased Interest 2 2 2   Down, Depressed, Hopeless 2 0 2  PHQ - 2 Score 4 2 4   Altered sleeping 3 3 3   Tired, decreased energy 2 3 3    Change in appetite 2 0 1  Feeling bad or failure about yourself  3 1 0  Trouble concentrating 0 1 0  Moving slowly or fidgety/restless 0 0 0  Suicidal thoughts 0 1 0  PHQ-9 Score 14 11 11   Difficult doing work/chores Not difficult at all Somewhat difficult Somewhat difficult      11/21/2022   10:13 AM 10/05/2022   11:24 AM 02/27/2022   11:48 AM 06/24/2021   11:02 AM  GAD 7 : Generalized Anxiety Score  Nervous, Anxious, on Edge 0 1 1 1   Control/stop worrying 0 0 1 3  Worry too much - different things 0 0 1 3  Trouble relaxing 1 2 0 3  Restless 0 0 0 1  Easily annoyed or irritable 3 3 1 3   Afraid - awful might happen 1 0 0 0  Total GAD 7 Score 5 6 4 14   Anxiety Difficulty Not difficult at all Somewhat difficult Somewhat difficult Somewhat difficult   Assessment & Plan:  1. Gastroesophageal reflux disease without esophagitis Well controlled on current regimen, no refills needed.   2. Acquired hypothyroidism Reviewed labwork from previous appt. Instructed patient of medication change. Patient has not yet changed medication. Instructed patient on proper timing of medication administration. Discussed with patient repeating labs in 8 weeks. Printed labs and instructions for patient.   3. Depression, major, single episode, moderate (HCC) Not at goal. Denies SI. Declined referral for counseling. Will continue current regimen and reassess.   4. GAD (generalized anxiety disorder) Well controlled on current regimen. Denies SI. Declined referral for counseling. Will continue current regimen and reassess.   5. Snores - Ambulatory referral to Sleep Studies  6. Laceration of left middle finger without foreign body with damage to nail, subsequent encounter Presented with FMLA paperwork to be completed.   The above assessment and management plan was discussed with the patient. The patient verbalized understanding of and has agreed to the management plan using shared-decision making. Patient  is aware to call the clinic if they develop any new symptoms or if symptoms fail to improve or worsen. Patient is aware when to return to the clinic for a follow-up visit. Patient educated on when it is appropriate to go to the emergency department.   Return in about 6 weeks (around 01/02/2023) for CPE.  Neale Burly, DNP-FNP [facility] [address] [place], [place] [address] [phone]

## 2022-11-21 NOTE — Patient Instructions (Signed)
Increase dose of levothyroxine to 100 mcg. Make sure that patient is taking it appropriately in am 2 hours before food or other medications. Will need to recheck in 8 weeks. Please schedule lab appt. Patient is now in prediabetes range. Recommend healthy lifestyle choices, including diet rich in fruits, vegetables, and lean proteins, and low in salt and simple carbohydrates and exercise (at least 30 minutes of moderate physical activity daily). Limit beverages high is sugar. Recommended at least 80-100 oz of water daily. Will recheck A1c in 3-6 months.

## 2022-11-26 ENCOUNTER — Other Ambulatory Visit: Payer: Self-pay | Admitting: Family Medicine

## 2022-11-26 DIAGNOSIS — H6993 Unspecified Eustachian tube disorder, bilateral: Secondary | ICD-10-CM

## 2022-11-27 ENCOUNTER — Telehealth: Payer: Self-pay | Admitting: Family Medicine

## 2022-11-28 DIAGNOSIS — Z0279 Encounter for issue of other medical certificate: Secondary | ICD-10-CM

## 2022-11-28 NOTE — Telephone Encounter (Signed)
FMLA form faxed to be completed and signed.  Form Fee Paid? (Y/N)  NO           If NO, form is placed on front office manager desk to hold until payment received. If YES, then form will be placed in the RX/HH Nurse Coordinators box for completion.  Form will not be processed until payment is received

## 2022-11-29 NOTE — Telephone Encounter (Signed)
Pt came in to office and paid form fee. Forms were put in [employer] box to be filled out.

## 2022-12-01 NOTE — Telephone Encounter (Signed)
Information completed and forwarded to PCP 

## 2022-12-08 NOTE — Telephone Encounter (Signed)
NA/VM full PCP completed and signed FMLA forms. They have been faxed to [employer] at fax number [phone].

## 2022-12-31 ENCOUNTER — Other Ambulatory Visit: Payer: Self-pay | Admitting: Family Medicine

## 2022-12-31 DIAGNOSIS — F321 Major depressive disorder, single episode, moderate: Secondary | ICD-10-CM

## 2022-12-31 DIAGNOSIS — E7800 Pure hypercholesterolemia, unspecified: Secondary | ICD-10-CM

## 2022-12-31 DIAGNOSIS — K219 Gastro-esophageal reflux disease without esophagitis: Secondary | ICD-10-CM

## 2023-01-02 ENCOUNTER — Ambulatory Visit: Payer: BC Managed Care – PPO | Admitting: Family Medicine

## 2023-02-06 ENCOUNTER — Ambulatory Visit: Payer: BC Managed Care – PPO | Admitting: Nurse Practitioner

## 2023-02-28 ENCOUNTER — Other Ambulatory Visit (HOSPITAL_BASED_OUTPATIENT_CLINIC_OR_DEPARTMENT_OTHER): Payer: Self-pay

## 2023-02-28 MED ORDER — WEGOVY 1 MG/0.5ML ~~LOC~~ SOAJ
1.0000 mg | SUBCUTANEOUS | 0 refills | Status: DC
  Filled 2023-02-28: qty 2, 28d supply, fill #0

## 2023-03-01 ENCOUNTER — Other Ambulatory Visit: Payer: Self-pay | Admitting: *Deleted

## 2023-03-01 DIAGNOSIS — E039 Hypothyroidism, unspecified: Secondary | ICD-10-CM

## 2023-03-02 ENCOUNTER — Other Ambulatory Visit (HOSPITAL_BASED_OUTPATIENT_CLINIC_OR_DEPARTMENT_OTHER): Payer: Self-pay

## 2023-03-03 ENCOUNTER — Other Ambulatory Visit (HOSPITAL_BASED_OUTPATIENT_CLINIC_OR_DEPARTMENT_OTHER): Payer: Self-pay

## 2023-03-15 ENCOUNTER — Encounter: Payer: Self-pay | Admitting: Family Medicine

## 2023-03-15 ENCOUNTER — Encounter: Payer: BC Managed Care – PPO | Admitting: Family Medicine

## 2023-03-28 ENCOUNTER — Other Ambulatory Visit (HOSPITAL_BASED_OUTPATIENT_CLINIC_OR_DEPARTMENT_OTHER): Payer: Self-pay

## 2023-03-28 MED ORDER — WEGOVY 1 MG/0.5ML ~~LOC~~ SOAJ
1.0000 mg | SUBCUTANEOUS | 0 refills | Status: AC
  Filled 2023-03-28 – 2023-05-12 (×3): qty 2, 28d supply, fill #0

## 2023-03-29 ENCOUNTER — Other Ambulatory Visit: Payer: Self-pay | Admitting: Family Medicine

## 2023-03-29 DIAGNOSIS — E7800 Pure hypercholesterolemia, unspecified: Secondary | ICD-10-CM

## 2023-03-29 DIAGNOSIS — F321 Major depressive disorder, single episode, moderate: Secondary | ICD-10-CM

## 2023-04-09 ENCOUNTER — Other Ambulatory Visit (HOSPITAL_BASED_OUTPATIENT_CLINIC_OR_DEPARTMENT_OTHER): Payer: Self-pay

## 2023-04-09 DIAGNOSIS — Z23 Encounter for immunization: Secondary | ICD-10-CM | POA: Diagnosis not present

## 2023-04-25 ENCOUNTER — Encounter: Payer: Self-pay | Admitting: Family Medicine

## 2023-04-25 ENCOUNTER — Other Ambulatory Visit: Payer: Self-pay | Admitting: Family Medicine

## 2023-04-25 DIAGNOSIS — E7800 Pure hypercholesterolemia, unspecified: Secondary | ICD-10-CM

## 2023-04-25 DIAGNOSIS — F321 Major depressive disorder, single episode, moderate: Secondary | ICD-10-CM

## 2023-04-25 NOTE — Telephone Encounter (Signed)
Gabrielle pt NTBS 30-d given 03/29/23

## 2023-04-25 NOTE — Telephone Encounter (Signed)
Called to schedule appt, no answer voicemail full Letter mailed

## 2023-04-28 ENCOUNTER — Other Ambulatory Visit (HOSPITAL_BASED_OUTPATIENT_CLINIC_OR_DEPARTMENT_OTHER): Payer: Self-pay

## 2023-05-03 ENCOUNTER — Encounter: Payer: Self-pay | Admitting: Family Medicine

## 2023-05-09 ENCOUNTER — Encounter: Payer: Self-pay | Admitting: Family Medicine

## 2023-05-09 ENCOUNTER — Ambulatory Visit: Payer: BC Managed Care – PPO | Admitting: Family Medicine

## 2023-05-11 ENCOUNTER — Other Ambulatory Visit (HOSPITAL_BASED_OUTPATIENT_CLINIC_OR_DEPARTMENT_OTHER): Payer: Self-pay

## 2023-05-12 ENCOUNTER — Other Ambulatory Visit (HOSPITAL_BASED_OUTPATIENT_CLINIC_OR_DEPARTMENT_OTHER): Payer: Self-pay

## 2023-05-25 ENCOUNTER — Other Ambulatory Visit: Payer: Self-pay | Admitting: Family Medicine

## 2023-05-25 DIAGNOSIS — H6993 Unspecified Eustachian tube disorder, bilateral: Secondary | ICD-10-CM

## 2023-05-28 ENCOUNTER — Other Ambulatory Visit (HOSPITAL_BASED_OUTPATIENT_CLINIC_OR_DEPARTMENT_OTHER): Payer: Self-pay

## 2023-05-28 MED ORDER — WEGOVY 1.7 MG/0.75ML ~~LOC~~ SOAJ
1.7000 mg | SUBCUTANEOUS | 0 refills | Status: AC
  Filled 2023-05-28: qty 3, 28d supply, fill #0

## 2023-06-08 ENCOUNTER — Telehealth: Payer: Self-pay

## 2023-06-08 NOTE — Progress Notes (Signed)
 [facility] Assistant attempted to call patient on today regarding preventative mammogram screening. No answer from patient after multiple rings. Assistant unable to leave confidential voicemail for patient to return call.  Will call back patient back for final attempt.   Baruch Gouty /VBCI  [facility] Assistant-Population Health [phone]

## 2023-06-25 ENCOUNTER — Other Ambulatory Visit (HOSPITAL_BASED_OUTPATIENT_CLINIC_OR_DEPARTMENT_OTHER): Payer: Self-pay

## 2023-06-25 MED ORDER — WEGOVY 2.4 MG/0.75ML ~~LOC~~ SOAJ
2.4000 mg | SUBCUTANEOUS | 0 refills | Status: DC
  Filled 2023-06-25: qty 3, 28d supply, fill #0

## 2023-06-27 ENCOUNTER — Other Ambulatory Visit: Payer: Self-pay

## 2023-07-18 ENCOUNTER — Other Ambulatory Visit (HOSPITAL_BASED_OUTPATIENT_CLINIC_OR_DEPARTMENT_OTHER): Payer: Self-pay

## 2023-07-18 MED ORDER — WEGOVY 2.4 MG/0.75ML ~~LOC~~ SOAJ
2.4000 mg | SUBCUTANEOUS | 1 refills | Status: AC
  Filled 2023-07-18 – 2023-08-11 (×2): qty 3, 28d supply, fill #0
  Filled 2023-11-14 – 2023-11-20 (×3): qty 3, 28d supply, fill #1

## 2023-07-30 ENCOUNTER — Other Ambulatory Visit (HOSPITAL_BASED_OUTPATIENT_CLINIC_OR_DEPARTMENT_OTHER): Payer: Self-pay

## 2023-08-11 ENCOUNTER — Other Ambulatory Visit (HOSPITAL_BASED_OUTPATIENT_CLINIC_OR_DEPARTMENT_OTHER): Payer: Self-pay

## 2023-08-13 ENCOUNTER — Other Ambulatory Visit (HOSPITAL_BASED_OUTPATIENT_CLINIC_OR_DEPARTMENT_OTHER): Payer: Self-pay

## 2023-08-17 ENCOUNTER — Other Ambulatory Visit (HOSPITAL_BASED_OUTPATIENT_CLINIC_OR_DEPARTMENT_OTHER): Payer: Self-pay

## 2023-08-18 ENCOUNTER — Other Ambulatory Visit (HOSPITAL_BASED_OUTPATIENT_CLINIC_OR_DEPARTMENT_OTHER): Payer: Self-pay

## 2023-09-12 ENCOUNTER — Other Ambulatory Visit (HOSPITAL_BASED_OUTPATIENT_CLINIC_OR_DEPARTMENT_OTHER): Payer: Self-pay

## 2023-09-12 MED ORDER — WEGOVY 2.4 MG/0.75ML ~~LOC~~ SOAJ
2.4000 mg | SUBCUTANEOUS | 1 refills | Status: AC
  Filled 2023-09-12 – 2023-10-03 (×2): qty 3, 28d supply, fill #0

## 2023-09-25 ENCOUNTER — Other Ambulatory Visit (HOSPITAL_BASED_OUTPATIENT_CLINIC_OR_DEPARTMENT_OTHER): Payer: Self-pay

## 2023-10-03 ENCOUNTER — Other Ambulatory Visit (HOSPITAL_BASED_OUTPATIENT_CLINIC_OR_DEPARTMENT_OTHER): Payer: Self-pay

## 2023-10-09 ENCOUNTER — Other Ambulatory Visit (HOSPITAL_BASED_OUTPATIENT_CLINIC_OR_DEPARTMENT_OTHER): Payer: Self-pay

## 2023-10-09 DIAGNOSIS — G4730 Sleep apnea, unspecified: Secondary | ICD-10-CM | POA: Diagnosis not present

## 2023-10-09 DIAGNOSIS — Z1330 Encounter for screening examination for mental health and behavioral disorders, unspecified: Secondary | ICD-10-CM | POA: Diagnosis not present

## 2023-10-09 DIAGNOSIS — E039 Hypothyroidism, unspecified: Secondary | ICD-10-CM | POA: Diagnosis not present

## 2023-10-09 DIAGNOSIS — K219 Gastro-esophageal reflux disease without esophagitis: Secondary | ICD-10-CM | POA: Diagnosis not present

## 2023-10-09 DIAGNOSIS — Z1231 Encounter for screening mammogram for malignant neoplasm of breast: Secondary | ICD-10-CM | POA: Diagnosis not present

## 2023-10-09 DIAGNOSIS — E66811 Obesity, class 1: Secondary | ICD-10-CM | POA: Diagnosis not present

## 2023-10-09 MED ORDER — WEGOVY 2.4 MG/0.75ML ~~LOC~~ SOAJ
2.4000 mg | SUBCUTANEOUS | 2 refills | Status: AC
Start: 2023-10-09 — End: ?

## 2023-10-17 ENCOUNTER — Encounter: Payer: Self-pay | Admitting: Family Medicine

## 2023-11-14 ENCOUNTER — Other Ambulatory Visit (HOSPITAL_BASED_OUTPATIENT_CLINIC_OR_DEPARTMENT_OTHER): Payer: Self-pay

## 2023-11-20 ENCOUNTER — Other Ambulatory Visit (HOSPITAL_BASED_OUTPATIENT_CLINIC_OR_DEPARTMENT_OTHER): Payer: Self-pay

## 2023-11-30 ENCOUNTER — Other Ambulatory Visit (HOSPITAL_BASED_OUTPATIENT_CLINIC_OR_DEPARTMENT_OTHER): Payer: Self-pay
